# Patient Record
Sex: Female | Born: 1960
Health system: Southern US, Community
[De-identification: ages and names within clinical notes are randomized; demographics above are authoritative.]

## PROBLEM LIST (undated history)

## (undated) DIAGNOSIS — S83209A Unspecified tear of unspecified meniscus, current injury, unspecified knee, initial encounter: Secondary | ICD-10-CM

## (undated) DIAGNOSIS — Z8616 Personal history of COVID-19: Secondary | ICD-10-CM

## (undated) DIAGNOSIS — N200 Calculus of kidney: Secondary | ICD-10-CM

## (undated) DIAGNOSIS — Z803 Family history of malignant neoplasm of breast: Secondary | ICD-10-CM

## (undated) DIAGNOSIS — Z1371 Encounter for nonprocreative screening for genetic disease carrier status: Secondary | ICD-10-CM

## (undated) DIAGNOSIS — F3281 Premenstrual dysphoric disorder: Secondary | ICD-10-CM

## (undated) DIAGNOSIS — N816 Rectocele: Secondary | ICD-10-CM

## (undated) DIAGNOSIS — J45990 Exercise induced bronchospasm: Secondary | ICD-10-CM

## (undated) DIAGNOSIS — R7303 Prediabetes: Secondary | ICD-10-CM

## (undated) DIAGNOSIS — N943 Premenstrual tension syndrome: Secondary | ICD-10-CM

## (undated) DIAGNOSIS — R002 Palpitations: Secondary | ICD-10-CM

## (undated) DIAGNOSIS — L719 Rosacea, unspecified: Secondary | ICD-10-CM

## (undated) DIAGNOSIS — Z8481 Family history of carrier of genetic disease: Secondary | ICD-10-CM

## (undated) DIAGNOSIS — M8580 Other specified disorders of bone density and structure, unspecified site: Secondary | ICD-10-CM

## (undated) DIAGNOSIS — E559 Vitamin D deficiency, unspecified: Secondary | ICD-10-CM

## (undated) DIAGNOSIS — I10 Essential (primary) hypertension: Secondary | ICD-10-CM

## (undated) DIAGNOSIS — Z8041 Family history of malignant neoplasm of ovary: Secondary | ICD-10-CM

## (undated) DIAGNOSIS — E785 Hyperlipidemia, unspecified: Secondary | ICD-10-CM

## (undated) DIAGNOSIS — E7800 Pure hypercholesterolemia, unspecified: Secondary | ICD-10-CM

## (undated) DIAGNOSIS — IMO0002 Reserved for concepts with insufficient information to code with codable children: Secondary | ICD-10-CM

## (undated) HISTORY — DX: Family history of malignant neoplasm of breast: Z80.3

## (undated) HISTORY — DX: Exercise induced bronchospasm: J45.990

## (undated) HISTORY — PX: COLONOSCOPY: SHX174

## (undated) HISTORY — DX: Other specified disorders of bone density and structure, unspecified site: M85.80

## (undated) HISTORY — DX: Rectocele: N81.6

## (undated) HISTORY — DX: Rosacea, unspecified: L71.9

## (undated) HISTORY — DX: Essential (primary) hypertension: I10

## (undated) HISTORY — DX: Hyperlipidemia, unspecified: E78.5

## (undated) HISTORY — DX: Personal history of COVID-19: Z86.16

## (undated) HISTORY — DX: Encounter for nonprocreative screening for genetic disease carrier status: Z13.71

## (undated) HISTORY — DX: Vitamin D deficiency, unspecified: E55.9

## (undated) HISTORY — DX: Family history of carrier of genetic disease: Z84.81

## (undated) HISTORY — DX: Pure hypercholesterolemia, unspecified: E78.00

## (undated) HISTORY — DX: Premenstrual tension syndrome: N94.3

## (undated) HISTORY — DX: Unspecified tear of unspecified meniscus, current injury, unspecified knee, initial encounter: S83.209A

## (undated) HISTORY — DX: Family history of malignant neoplasm of ovary: Z80.41

## (undated) HISTORY — DX: Palpitations: R00.2

## (undated) HISTORY — DX: Calculus of kidney: N20.0

## (undated) HISTORY — DX: Premenstrual dysphoric disorder: F32.81

## (undated) HISTORY — DX: Prediabetes: R73.03

## (undated) HISTORY — DX: Reserved for concepts with insufficient information to code with codable children: IMO0002

---

## 1999-10-06 ENCOUNTER — Other Ambulatory Visit: Admission: RE | Admit: 1999-10-06 | Discharge: 1999-10-06 | Payer: Self-pay | Admitting: Obstetrics and Gynecology

## 2000-12-30 ENCOUNTER — Other Ambulatory Visit: Admission: RE | Admit: 2000-12-30 | Discharge: 2000-12-30 | Payer: Self-pay | Admitting: Obstetrics and Gynecology

## 2002-10-23 ENCOUNTER — Other Ambulatory Visit: Admission: RE | Admit: 2002-10-23 | Discharge: 2002-10-23 | Payer: Self-pay | Admitting: Obstetrics and Gynecology

## 2002-11-08 ENCOUNTER — Encounter: Payer: Self-pay | Admitting: Obstetrics and Gynecology

## 2002-11-08 ENCOUNTER — Ambulatory Visit (HOSPITAL_COMMUNITY): Admission: RE | Admit: 2002-11-08 | Discharge: 2002-11-08 | Payer: Self-pay | Admitting: Obstetrics and Gynecology

## 2003-12-24 ENCOUNTER — Other Ambulatory Visit: Admission: RE | Admit: 2003-12-24 | Discharge: 2003-12-24 | Payer: Self-pay | Admitting: Obstetrics and Gynecology

## 2005-07-30 ENCOUNTER — Other Ambulatory Visit: Admission: RE | Admit: 2005-07-30 | Discharge: 2005-07-30 | Payer: Self-pay | Admitting: Obstetrics and Gynecology

## 2005-08-31 ENCOUNTER — Ambulatory Visit: Payer: Self-pay | Admitting: Internal Medicine

## 2005-09-09 ENCOUNTER — Other Ambulatory Visit: Admission: RE | Admit: 2005-09-09 | Discharge: 2005-09-09 | Payer: Self-pay | Admitting: Obstetrics and Gynecology

## 2005-11-13 ENCOUNTER — Encounter: Admission: RE | Admit: 2005-11-13 | Discharge: 2005-11-13 | Payer: Self-pay | Admitting: Obstetrics & Gynecology

## 2006-04-22 ENCOUNTER — Other Ambulatory Visit: Admission: RE | Admit: 2006-04-22 | Discharge: 2006-04-22 | Payer: Self-pay | Admitting: Obstetrics & Gynecology

## 2006-07-02 ENCOUNTER — Other Ambulatory Visit: Admission: RE | Admit: 2006-07-02 | Discharge: 2006-07-02 | Payer: Self-pay | Admitting: Obstetrics & Gynecology

## 2006-10-19 ENCOUNTER — Other Ambulatory Visit: Admission: RE | Admit: 2006-10-19 | Discharge: 2006-10-19 | Payer: Self-pay | Admitting: Obstetrics & Gynecology

## 2007-12-22 ENCOUNTER — Other Ambulatory Visit: Admission: RE | Admit: 2007-12-22 | Discharge: 2007-12-22 | Payer: Self-pay | Admitting: Obstetrics and Gynecology

## 2008-02-02 ENCOUNTER — Encounter: Admission: RE | Admit: 2008-02-02 | Discharge: 2008-02-02 | Payer: Self-pay | Admitting: Obstetrics and Gynecology

## 2009-02-05 ENCOUNTER — Other Ambulatory Visit: Admission: RE | Admit: 2009-02-05 | Discharge: 2009-02-05 | Payer: Self-pay | Admitting: Obstetrics and Gynecology

## 2009-02-12 ENCOUNTER — Ambulatory Visit: Payer: Self-pay | Admitting: Genetic Counselor

## 2009-03-18 DIAGNOSIS — Z1371 Encounter for nonprocreative screening for genetic disease carrier status: Secondary | ICD-10-CM

## 2009-03-18 HISTORY — DX: Encounter for nonprocreative screening for genetic disease carrier status: Z13.71

## 2010-06-24 ENCOUNTER — Encounter: Admission: RE | Admit: 2010-06-24 | Discharge: 2010-06-24 | Payer: Self-pay | Admitting: Obstetrics & Gynecology

## 2012-07-18 ENCOUNTER — Other Ambulatory Visit: Payer: Self-pay | Admitting: Obstetrics & Gynecology

## 2012-07-18 DIAGNOSIS — Z1231 Encounter for screening mammogram for malignant neoplasm of breast: Secondary | ICD-10-CM

## 2012-07-29 ENCOUNTER — Ambulatory Visit
Admission: RE | Admit: 2012-07-29 | Discharge: 2012-07-29 | Disposition: A | Discharge status: Home / Self Care | Payer: BC Managed Care – PPO | Source: Ambulatory Visit | Attending: Obstetrics & Gynecology | Admitting: Obstetrics & Gynecology

## 2012-07-29 DIAGNOSIS — Z1231 Encounter for screening mammogram for malignant neoplasm of breast: Secondary | ICD-10-CM

## 2012-08-02 ENCOUNTER — Other Ambulatory Visit: Payer: Self-pay | Admitting: Obstetrics & Gynecology

## 2012-08-02 DIAGNOSIS — R928 Other abnormal and inconclusive findings on diagnostic imaging of breast: Secondary | ICD-10-CM

## 2012-08-05 ENCOUNTER — Other Ambulatory Visit: Payer: Self-pay | Admitting: Obstetrics & Gynecology

## 2012-08-05 ENCOUNTER — Ambulatory Visit
Admission: RE | Admit: 2012-08-05 | Discharge: 2012-08-05 | Disposition: A | Discharge status: Home / Self Care | Payer: BC Managed Care – PPO | Source: Ambulatory Visit | Attending: Obstetrics & Gynecology | Admitting: Obstetrics & Gynecology

## 2012-08-05 DIAGNOSIS — R928 Other abnormal and inconclusive findings on diagnostic imaging of breast: Secondary | ICD-10-CM

## 2012-08-05 LAB — HM MAMMOGRAPHY: HM Mammogram: NEGATIVE

## 2012-08-12 ENCOUNTER — Other Ambulatory Visit: Payer: BC Managed Care – PPO

## 2013-04-04 ENCOUNTER — Encounter: Payer: Self-pay | Admitting: *Deleted

## 2013-04-10 ENCOUNTER — Encounter: Payer: Self-pay | Admitting: Nurse Practitioner

## 2013-04-10 ENCOUNTER — Ambulatory Visit (INDEPENDENT_AMBULATORY_CARE_PROVIDER_SITE_OTHER): Payer: BC Managed Care – PPO | Admitting: Nurse Practitioner

## 2013-04-10 VITALS — BP 114/66 | HR 62 | Resp 14 | Ht 69.0 in | Wt 143.2 lb

## 2013-04-10 DIAGNOSIS — E559 Vitamin D deficiency, unspecified: Secondary | ICD-10-CM

## 2013-04-10 DIAGNOSIS — Z01419 Encounter for gynecological examination (general) (routine) without abnormal findings: Secondary | ICD-10-CM

## 2013-04-10 DIAGNOSIS — Z0000 Encounter for general adult medical examination without abnormal findings: Secondary | ICD-10-CM

## 2013-04-10 LAB — POCT URINALYSIS DIPSTICK
Leukocytes, UA: NEGATIVE
Spec Grav, UA: 1.015
pH, UA: 5.5

## 2013-04-10 MED ORDER — VITAMIN D (ERGOCALCIFEROL) 1.25 MG (50000 UNIT) PO CAPS: 50000.0000 [IU] | ORAL_CAPSULE | ORAL | Status: DC

## 2013-04-10 NOTE — Patient Instructions (Addendum)

## 2013-04-10 NOTE — Progress Notes (Signed)
Reviewed personally.  M. Suzanne Calena Salem, MD.  

## 2013-04-10 NOTE — Progress Notes (Signed)
52 y.o. G4P3 Married Caucasian Fe here for annual exam.  Menses regular at 24 days, flow for 5 days, 1 day heavier. No vaso symptoms.  Feels well.  Some marital issues currently and patient feeling stressed.  Patient's last menstrual period was 03/26/2013.          Sexually active: yes  The current method of family planning is vasectomy.    Exercising: yes  run Smoker:  no  Health Maintenance: Pap:  04/05/2012  Normal with negative HR HPV MMG:  08/05/2012  negative Colonoscopy:  05/20/2012 normal and recheck in 10 yrs. BMD:   never TDaP:  02/05/2009 Labs: PCP does lab (blood) work.    reports that she has never smoked. She has never used smokeless tobacco. She reports that she drinks about 2.0 ounces of alcohol per week. She reports that she does not use illicit drugs.  Past Medical History  Diagnosis Date  . BRCA negative   . PMS (premenstrual syndrome)   . Osteopenia   . Cystocele   . Rectocele   . Kidney stone     Past Surgical History  Procedure Laterality Date  . Vaginal delivery      x3  . Colonoscopy      Current Outpatient Prescriptions  Medication Sig Dispense Refill  . aspirin 81 MG tablet Take 81 mg by mouth daily.      Marland Kitchen aspirin-acetaminophen-caffeine (EXCEDRIN MIGRAINE) 250-250-65 MG per tablet Take 1 tablet by mouth every 6 (six) hours as needed for pain.      . calcium-vitamin D (OSCAL WITH D) 250-125 MG-UNIT per tablet Take 1 tablet by mouth daily.      Marland Kitchen ibuprofen (ADVIL,MOTRIN) 200 MG tablet Take 200 mg by mouth every 6 (six) hours as needed for pain.      Marland Kitchen loratadine (CLARITIN) 10 MG tablet Take 10 mg by mouth daily.      . Multiple Vitamin (MULTIVITAMIN) tablet Take 1 tablet by mouth daily.      . Vitamin D, Ergocalciferol, (DRISDOL) 50000 UNITS CAPS Take 50,000 Units by mouth every 7 (seven) days.       No current facility-administered medications for this visit.    Family History  Problem Relation Age of Onset  . Breast cancer Mother   . Breast  cancer Maternal Aunt   . Cancer Maternal Grandmother     colon cancer  . Breast cancer Cousin     ROS:  Pertinent items are noted in HPI.  Otherwise, a comprehensive ROS was negative.  Exam:   BP 114/66  Pulse 62  Resp 14  Ht 5\' 9"  (1.753 m)  Wt 143 lb 3.2 oz (64.955 kg)  BMI 21.14 kg/m2  LMP 03/26/2013 Height: 5\' 9"  (175.3 cm)  Ht Readings from Last 3 Encounters:  04/10/13 5\' 9"  (1.753 m)  Weight on 04/05/12 was 155 lb. Down 12 lbs.  General appearance: alert, cooperative and appears stated age, seems sad today, tearful at times Head: Normocephalic, without obvious abnormality, atraumatic Neck: no adenopathy, supple, symmetrical, trachea midline and thyroid normal to inspection and palpation Lungs: clear to auscultation bilaterally Breasts: normal appearance, no masses or tenderness Heart: regular rate and rhythm Abdomen: soft, non-tender; no masses,  no organomegaly Extremities: extremities normal, atraumatic, no cyanosis or edema Skin: Skin color, texture, turgor normal. No rashes or lesions Lymph nodes: Cervical, supraclavicular, and axillary nodes normal. No abnormal inguinal nodes palpated Neurologic: Grossly normal   Pelvic: External genitalia:  no lesions  Urethra:  normal appearing urethra with no masses, tenderness or lesions              Bartholin's and Skene's: normal                 Vagina: normal appearing vagina with normal color and discharge, no lesions              Cervix: anteverted              Pap taken: no Bimanual Exam:  Uterus:  normal size, contour, position, consistency, mobility, non-tender              Adnexa: no mass, fullness, tenderness               Rectovaginal: Confirms               Anus:  normal sphincter tone, no lesions  A:  Well Woman with normal exam  Husband vasectomy  Perimenopausal with regular cycles  Situational anxiety  P:   Pap smear as per guidelines   Mammogram due 10/14  Discussed treatment options for her  anxiety and have advised her to see PCP   for evaluation and follow up  counseled on adequate intake of calcium and vitamin D, diet and exercise return annually or prn  An After Visit Summary was printed and given to the patient.

## 2013-04-11 ENCOUNTER — Telehealth: Payer: Self-pay | Admitting: *Deleted

## 2013-04-11 NOTE — Telephone Encounter (Signed)
LVM for pt to return my call in regards to lab results.  

## 2013-04-11 NOTE — Telephone Encounter (Signed)
Message copied by Osie Bond on Tue Apr 11, 2013  4:24 PM ------      Message from: Roanna Banning      Created: Tue Apr 11, 2013  3:25 PM       Let patient know vit D level is great and follow protocol ------

## 2013-04-12 ENCOUNTER — Telehealth: Payer: Self-pay | Admitting: *Deleted

## 2013-04-12 NOTE — Telephone Encounter (Signed)
Pt is aware of vitamin d lab results and will continue to take vitamin d 600-800 IU (otc) po qd.

## 2013-04-12 NOTE — Telephone Encounter (Signed)
Message copied by Osie Bond on Wed Apr 12, 2013  3:35 PM ------      Message from: Roanna Banning      Created: Tue Apr 11, 2013  3:25 PM       Let patient know vit D level is great and follow protocol ------

## 2013-04-12 NOTE — Telephone Encounter (Signed)
Pt is aware of vitamin d lab results and will continue to take vitamin d 600-800 IU (otc) po qd.  

## 2014-01-29 ENCOUNTER — Other Ambulatory Visit: Payer: Self-pay

## 2014-01-29 DIAGNOSIS — Z1231 Encounter for screening mammogram for malignant neoplasm of breast: Secondary | ICD-10-CM

## 2014-01-31 ENCOUNTER — Ambulatory Visit
Admission: RE | Admit: 2014-01-31 | Discharge: 2014-01-31 | Disposition: A | Discharge status: Home / Self Care | Payer: BC Managed Care – PPO | Source: Ambulatory Visit

## 2014-01-31 DIAGNOSIS — Z1231 Encounter for screening mammogram for malignant neoplasm of breast: Secondary | ICD-10-CM

## 2014-04-12 ENCOUNTER — Encounter: Payer: Self-pay | Admitting: Nurse Practitioner

## 2014-04-12 ENCOUNTER — Ambulatory Visit (INDEPENDENT_AMBULATORY_CARE_PROVIDER_SITE_OTHER): Payer: BC Managed Care – PPO | Admitting: Nurse Practitioner

## 2014-04-12 VITALS — BP 110/70 | HR 68 | Resp 16 | Ht 69.25 in | Wt 147.0 lb

## 2014-04-12 DIAGNOSIS — Z01419 Encounter for gynecological examination (general) (routine) without abnormal findings: Secondary | ICD-10-CM

## 2014-04-12 DIAGNOSIS — Z0000 Encounter for general adult medical examination without abnormal findings: Secondary | ICD-10-CM

## 2014-04-12 LAB — POCT URINALYSIS DIPSTICK
Bilirubin, UA: NEGATIVE
Glucose, UA: NEGATIVE
KETONES UA: NEGATIVE
LEUKOCYTES UA: NEGATIVE
Nitrite, UA: NEGATIVE
PH UA: 5
PROTEIN UA: NEGATIVE
RBC UA: NEGATIVE
Urobilinogen, UA: NEGATIVE

## 2014-04-12 NOTE — Progress Notes (Signed)
53 y.o. G4P3 Separated Caucasian Fe here for annual exam.  She and husband have been working on marital issues for some time.  He has now moved out in February.  She feels heart broken that she has tried so hard to make things work in their relationship.  Some warm flushes.  Menses are very regular at 24 days.  Flow for 5-6 days. Moderate to light.  No cramps.  Not SA.  Patient's last menstrual period was 04/07/2014.          Sexually active: yes  The current method of family planning is vasectomy.    Exercising: yes  Run 4 - 5 x weekly Smoker:  no  Health Maintenance: Pap:  04/2012 Neg. HR HPV neg  MMG:  01/2014 BIRADS1 Colonoscopy:  2013 - Normal Every 5 years TDaP:  03/2014 Labs: PCP   reports that she has never smoked. She has never used smokeless tobacco. She reports that she drinks about 4.2 ounces of alcohol per week. She reports that she does not use illicit drugs.  Past Medical History  Diagnosis Date  . PMS (premenstrual syndrome)   . Osteopenia   . Cystocele   . Rectocele   . Kidney stone 20's  . BRCA1 negative 03/18/09    [facility] Cancer - mother, MGM, MA, MGA X 2, Mcousin    Past Surgical History  Procedure Laterality Date  . Vaginal delivery      x3  . Colonoscopy      Current Outpatient Prescriptions  Medication Sig Dispense Refill  . aspirin 81 MG tablet Take 81 mg by mouth daily.      Marland Kitchen aspirin-acetaminophen-caffeine (EXCEDRIN MIGRAINE) 250-250-65 MG per tablet Take 1 tablet by mouth every 6 (six) hours as needed for pain.      Marland Kitchen ibuprofen (ADVIL,MOTRIN) 200 MG tablet Take 200 mg by mouth every 6 (six) hours as needed for pain.      Marland Kitchen loratadine (CLARITIN) 10 MG tablet Take 10 mg by mouth as needed.       . Multiple Vitamin (MULTIVITAMIN) tablet Take 1 tablet by mouth daily.      . Vitamin D, Ergocalciferol, (DRISDOL) 50000 UNITS CAPS Take 1 capsule (50,000 Units total) by mouth every 7 (seven) days.  30 capsule  3  . calcium-vitamin D (OSCAL WITH D)  250-125 MG-UNIT per tablet Take 1 tablet by mouth daily.       No current facility-administered medications for this visit.    Family History  Problem Relation Age of Onset  . Breast cancer Mother 55  . Breast cancer Maternal Aunt 60  . Cancer - Colon Maternal Grandmother     colon cancer  . Breast cancer Cousin   . Heart disease Father   . Hypertension Father   . Breast cancer Cousin 25    maternal    ROS:  Pertinent items are noted in HPI.  Otherwise, a comprehensive ROS was negative.  Exam:   BP 110/70  Pulse 68  Resp 16  Ht 5' 9.25" (1.759 m)  Wt 147 lb (66.679 kg)  BMI 21.55 kg/m2  LMP 04/07/2014 Height: 5' 9.25" (175.9 cm)  Ht Readings from Last 3 Encounters:  04/12/14 5' 9.25" (1.759 m)  04/10/13 '5\' 9"'  (1.753 m)    General appearance: alert, cooperative and appears stated age Head: Normocephalic, without obvious abnormality, atraumatic Neck: no adenopathy, supple, symmetrical, trachea midline and thyroid normal to inspection and palpation Lungs: clear to auscultation bilaterally Breasts: normal  appearance, no masses or tenderness Heart: regular rate and rhythm Abdomen: soft, non-tender; no masses,  no organomegaly Extremities: extremities normal, atraumatic, no cyanosis or edema Skin: Skin color, texture, turgor normal. No rashes or lesions Lymph nodes: Cervical, supraclavicular, and axillary nodes normal. No abnormal inguinal nodes palpated Neurologic: Grossly normal   Pelvic: External genitalia:  no lesions              Urethra:  normal appearing urethra with no masses, tenderness or lesions              Bartholin's and Skene's: normal                 Vagina: normal appearing vagina with normal color and discharge, no lesions              Cervix: anteverted              Pap taken: no Bimanual Exam:  Uterus:  normal size, contour, position, consistency, mobility, non-tender              Adnexa: no mass, fullness, tenderness               Rectovaginal:  Confirms               Anus:  normal sphincter tone, no lesions  A:  Well Woman with normal exam  Perimenopausal still with regular menses  Marital discord  P:   Reviewed health and wellness pertinent to exam  Pap smear not taken today  Mammogram is due 4/16  Counseled on breast self exam, mammography screening, adequate intake of calcium and vitamin D, diet and exercise, Kegel's exercises return annually or prn  An After Visit Summary was printed and given to the patient.

## 2014-04-12 NOTE — Patient Instructions (Signed)

## 2014-04-19 NOTE — Progress Notes (Signed)
Encounter reviewed by Dr. Brook Silva.  

## 2014-09-03 ENCOUNTER — Encounter: Payer: Self-pay | Admitting: Nurse Practitioner

## 2015-04-18 ENCOUNTER — Ambulatory Visit: Payer: BC Managed Care – PPO | Admitting: Nurse Practitioner

## 2015-04-30 ENCOUNTER — Ambulatory Visit (INDEPENDENT_AMBULATORY_CARE_PROVIDER_SITE_OTHER): Payer: BLUE CROSS/BLUE SHIELD | Admitting: Nurse Practitioner

## 2015-04-30 ENCOUNTER — Encounter: Payer: Self-pay | Admitting: Nurse Practitioner

## 2015-04-30 VITALS — BP 110/66 | HR 64 | Ht 69.5 in | Wt 147.0 lb

## 2015-04-30 DIAGNOSIS — Z0000 Encounter for general adult medical examination without abnormal findings: Secondary | ICD-10-CM | POA: Diagnosis not present

## 2015-04-30 DIAGNOSIS — Z01419 Encounter for gynecological examination (general) (routine) without abnormal findings: Secondary | ICD-10-CM

## 2015-04-30 LAB — POCT URINALYSIS DIPSTICK
Bilirubin, UA: NEGATIVE
Glucose, UA: NEGATIVE
KETONES UA: NEGATIVE
Leukocytes, UA: NEGATIVE
Nitrite, UA: NEGATIVE
PROTEIN UA: NEGATIVE
RBC UA: NEGATIVE
UROBILINOGEN UA: NEGATIVE
pH, UA: 5

## 2015-04-30 NOTE — Progress Notes (Signed)
Encounter reviewed by Dr. Brook Amundson C. Silva.  

## 2015-04-30 NOTE — Progress Notes (Signed)
Patient ID: Mary Porter, female   DOB: 1961-09-19, 54 y.o.   MRN: [id number] 54 y.o. G4P3 Married  Caucasian Fe here for annual exam.  Menses 21- 24 days.  Moderate to light, lasting 5-7.  No vaso symptoms.  She and husband still working on issues.  Patient's last menstrual period was 04/20/2015 (exact date).          Sexually active: Yes.    The current method of family planning is vasectomy.    Exercising: Yes.    Gym/ health club routine includes light weights, low impact aerobics and treadmill. Smoker:  no  Health Maintenance: Pap:  04/05/12, negative with neg HR HPV MMG:  01/31/14, Bi-Rads 1:  Negative  Colonoscopy:  05/10/12, normal, repeat in 5 years BMD:  Years ago, normal per patient TDaP:  03/2014 Labs:  HB:  12.7  Urine: negative    reports that she has never smoked. She has never used smokeless tobacco. She reports that she drinks about 4.2 oz of alcohol per week. She reports that she does not use illicit drugs.  Past Medical History  Diagnosis Date  . PMS (premenstrual syndrome)   . Osteopenia   . Cystocele   . Rectocele   . Kidney stone 20's  . BRCA1 negative 03/18/09    [facility] - mother, MGM, MA, MGA X 2, Mcousin    Past Surgical History  Procedure Laterality Date  . Vaginal delivery      x3  . Colonoscopy      Current Outpatient Prescriptions  Medication Sig Dispense Refill  . aspirin 81 MG tablet Take 81 mg by mouth daily.    Marland Kitchen aspirin-acetaminophen-caffeine (EXCEDRIN MIGRAINE) 250-250-65 MG per tablet Take 1 tablet by mouth every 6 (six) hours as needed for pain.    . calcium-vitamin D (OSCAL WITH D) 250-125 MG-UNIT per tablet Take 1 tablet by mouth daily.    Marland Kitchen ibuprofen (ADVIL,MOTRIN) 200 MG tablet Take 200 mg by mouth every 6 (six) hours as needed for pain.    Marland Kitchen loratadine (CLARITIN) 10 MG tablet Take 10 mg by mouth as needed.     . Multiple Vitamin (MULTIVITAMIN) tablet Take 1 tablet by mouth daily.    . Vitamin D, Ergocalciferol, (DRISDOL)  50000 UNITS CAPS Take 1 capsule (50,000 Units total) by mouth every 7 (seven) days. (Patient not taking: Reported on 04/30/2015) 30 capsule 3   No current facility-administered medications for this visit.    Family History  Problem Relation Age of Onset  . Breast cancer Mother 74  . Breast cancer Maternal Aunt 60  . Cancer - Colon Maternal Grandmother     colon cancer  . Breast cancer Cousin   . Heart disease Father   . Hypertension Father   . Breast cancer Cousin 25    maternal    ROS:  Pertinent items are noted in HPI.  Otherwise, a comprehensive ROS was negative.  Exam:   BP 110/66 mmHg  Pulse 64  Ht 5' 9.5" (1.765 m)  Wt 147 lb (66.679 kg)  BMI 21.40 kg/m2  LMP 04/20/2015 (Exact Date) Height: 5' 9.5" (176.5 cm) Ht Readings from Last 3 Encounters:  04/30/15 5' 9.5" (1.765 m)  04/12/14 5' 9.25" (1.759 m)  04/10/13 _0  (1.753 m)    General appearance: alert, cooperative and appears stated age Head: Normocephalic, without obvious abnormality, atraumatic Neck: no adenopathy, supple, symmetrical, trachea midline and thyroid normal to inspection and palpation Lungs: clear to auscultation bilaterally  Breasts: normal appearance, no masses or tenderness Heart: regular rate and rhythm Abdomen: soft, non-tender; no masses,  no organomegaly Extremities: extremities normal, atraumatic, no cyanosis or edema Skin: Skin color, texture, turgor normal. No rashes or lesions Lymph nodes: Cervical, supraclavicular, and axillary nodes normal. No abnormal inguinal nodes palpated Neurologic: Grossly normal   Pelvic: External genitalia:  no lesions              Urethra:  normal appearing urethra with no masses, tenderness or lesions              Bartholin's and Skene's: normal                 Vagina: normal appearing vagina with normal color and discharge, no lesions              Cervix: anteverted              Pap taken: Yes.   Bimanual Exam:  Uterus:  normal size, contour, position,  consistency, mobility, non-tender              Adnexa: no mass, fullness, tenderness               Rectovaginal: Confirms               Anus:  normal sphincter tone, no lesions  Chaperone present:  yes  A:  Well Woman with normal exam  Perimenopausal still with regular menses Marital discord   P:   Reviewed health and wellness pertinent to exam  Pap smear as above  Mammogram is due and will schedule  Keep menses record  Counseled on breast self exam, mammography screening, adequate intake of calcium and vitamin D, diet and exercise return annually or prn  An After Visit Summary was printed and given to the patient.

## 2015-04-30 NOTE — Patient Instructions (Addendum)

## 2015-05-03 LAB — IPS PAP TEST WITH HPV

## 2015-05-07 LAB — HEMOGLOBIN, FINGERSTICK: HEMOGLOBIN, FINGERSTICK: 12.7 g/dL (ref 12.0–16.0)

## 2015-06-18 ENCOUNTER — Other Ambulatory Visit: Payer: Self-pay

## 2015-06-18 DIAGNOSIS — Z1231 Encounter for screening mammogram for malignant neoplasm of breast: Secondary | ICD-10-CM

## 2015-07-23 ENCOUNTER — Ambulatory Visit
Admission: RE | Admit: 2015-07-23 | Discharge: 2015-07-23 | Disposition: A | Discharge status: Home / Self Care | Payer: 59 | Source: Ambulatory Visit

## 2015-07-23 DIAGNOSIS — Z1231 Encounter for screening mammogram for malignant neoplasm of breast: Secondary | ICD-10-CM

## 2015-07-25 ENCOUNTER — Other Ambulatory Visit: Payer: Self-pay | Admitting: Nurse Practitioner

## 2015-07-25 DIAGNOSIS — R928 Other abnormal and inconclusive findings on diagnostic imaging of breast: Secondary | ICD-10-CM

## 2015-07-26 ENCOUNTER — Ambulatory Visit
Admission: RE | Admit: 2015-07-26 | Discharge: 2015-07-26 | Disposition: A | Discharge status: Home / Self Care | Payer: 59 | Source: Ambulatory Visit | Attending: Nurse Practitioner | Admitting: Nurse Practitioner

## 2015-07-26 DIAGNOSIS — R928 Other abnormal and inconclusive findings on diagnostic imaging of breast: Secondary | ICD-10-CM

## 2015-12-11 ENCOUNTER — Telehealth: Payer: Self-pay | Admitting: Nurse Practitioner

## 2015-12-11 ENCOUNTER — Ambulatory Visit (INDEPENDENT_AMBULATORY_CARE_PROVIDER_SITE_OTHER): Payer: 59 | Admitting: Sports Medicine

## 2015-12-11 ENCOUNTER — Encounter: Payer: Self-pay | Admitting: Sports Medicine

## 2015-12-11 VITALS — BP 129/71 | Ht 69.0 in | Wt 155.0 lb

## 2015-12-11 DIAGNOSIS — M23306 Other meniscus derangements, unspecified meniscus, right knee: Secondary | ICD-10-CM | POA: Diagnosis not present

## 2015-12-11 DIAGNOSIS — M23303 Other meniscus derangements, unspecified medial meniscus, right knee: Secondary | ICD-10-CM | POA: Diagnosis not present

## 2015-12-11 DIAGNOSIS — M25561 Pain in right knee: Secondary | ICD-10-CM

## 2015-12-11 DIAGNOSIS — M23300 Other meniscus derangements, unspecified lateral meniscus, right knee: Secondary | ICD-10-CM | POA: Diagnosis not present

## 2015-12-11 NOTE — Telephone Encounter (Signed)
Spoke with patient. Patient states that her cycle started on January 28th and she is still bleeding. Reports bleeding was "very heavy in the beginning." Bleeding has returned to a normal flow. Reports she has not missed any cycles. Has been having cycles monthly that usually last for 5-7 days. States she did go longer in between cycle this month. Denies any fatigue or dizziness. Current method of birth control is vasectomy. Advised she will need to be seen in the office for further evaluation. She is agreeable. Appointment scheduled for 2/10 at 9:45 am with Ria Comment, FNP. She is agreeable to date and time.  Routing to provider for final review. Patient agreeable to disposition. Will close encounter.

## 2015-12-11 NOTE — Progress Notes (Signed)
Mary Porter - 55 y.o. female MRN [id number]  Date of birth: Sep 27, 1961  CC: Right knee pain  SUBJECTIVE:   HPI  Mary Porter is a very pleasant 55 year old female with 3-4 months of right knee pain who is here for evaluation and follow-up on an MRI done by another provider. She states that her knee pain began insidiously in November 2016. She denies any trauma. At that time she had been very active doing [employer] high interval training and running as well as occasional yoga. She denied any locking catching or giving way at that time or now. She denies any fevers chills or night sweats. She was seen by which showed a medial posterior horn degenerative meniscal tear as well as a relatively large Baker's cyst. She also had an MRI of her lumbar spine which was unremarkable. These reports were reviewed today although the images were not available. She initially had some burning pain radiating down from the lateral aspect of her knee to the anterior ankle. After resting for 2 weeks over Christmas break this is resolved and the majority of her knee pain has gone away with the help of an injection in December 2016. She also wore a patellar tendon stabilization brace which is helped although she does not continue to use this. She is not using any medications. Currently she is walking biking and using the elliptical which provide minimal pain. She also modifies her yoga to limit terminal knee flexion.  ROS:     As above, no fevers chills night sweats. No rashes or joint swelling.  HISTORY: Past Medical, Surgical, Social, and Family History Reviewed & Updated per EMR.  Pertinent Historical Findings include: Osteopenia, never smoker  OBJECTIVE: BP 129/71 mmHg  Ht  (1.753 m)  Wt 155 lb (70.308 kg)  BMI 22.88 kg/m2  Physical Exam  Calm, no acute distress, nonlabored breathing  Knee: Right Normal to inspection with no erythema or effusion or obvious bony abnormalities. Palpation normal with no  warmth or joint line tenderness or patellar tenderness or condyle tenderness. Range of motion is full with flexion although terminal discomfort with extension mentioned to 5 Ligaments with solid consistent endpoints including ACL, PCL, LCL, MCL. Negative Mcmurray's and provocative meniscal tests. Non painful patellar compression. Quad muscle bulk for compared to the contralateral side, 42 versus 44 cm in circumference Hip abduction strength on right side is 4 out of 5 compared to 5 out of 5 on the left. leg lengths are equal.  MEDICATIONS, LABS & OTHER ORDERS: Previous Medications   ASPIRIN 81 MG TABLET    Take 81 mg by mouth daily.   ASPIRIN-ACETAMINOPHEN-CAFFEINE (EXCEDRIN MIGRAINE) 250-250-65 MG PER TABLET    Take 1 tablet by mouth every 6 (six) hours as needed for pain.   CALCIUM-VITAMIN D (OSCAL WITH D) 250-125 MG-UNIT PER TABLET    Take 1 tablet by mouth daily.   IBUPROFEN (ADVIL,MOTRIN) 200 MG TABLET    Take 200 mg by mouth every 6 (six) hours as needed for pain.   LORATADINE (CLARITIN) 10 MG TABLET    Take 10 mg by mouth as needed.    MULTIPLE VITAMIN (MULTIVITAMIN) TABLET    Take 1 tablet by mouth daily.   VITAMIN D, ERGOCALCIFEROL, (DRISDOL) 50000 UNITS CAPS    Take 1 capsule (50,000 Units total) by mouth every 7 (seven) days.   Modified Medications   No medications on file   New Prescriptions   No medications on file   Discontinued Medications  No medications on file  No orders of the defined types were placed in this encounter.   ASSESSMENT & PLAN: Right knee pain with degenerative posterior horn medial meniscal tear on the right side.  Mary Porter is very active and her exam does not at all suggest severe patellofemoral arthritis as identified on the MRI other than lack of terminal extension. She does have signs of a degenerative posterior horn medial meniscal tear with pain exhibited with deep knee flexion. She has significant weakness and quad atrophy on the right side  as well as hip abduction weakness. We discussed ideal exercises including recumbent biking, walking, and Pilates. She will plan on participating in Pilates to help fix her unilateral quad and pelvic stabilization weakness. We will see her back in 2 months. She is to call with any questions in the interim.

## 2015-12-11 NOTE — Telephone Encounter (Signed)
Patient has been bleeding for 11 days and is still bleeding. She is concern and wants to talk with the nurse.

## 2015-12-13 ENCOUNTER — Encounter: Payer: Self-pay | Admitting: Nurse Practitioner

## 2015-12-13 ENCOUNTER — Ambulatory Visit (INDEPENDENT_AMBULATORY_CARE_PROVIDER_SITE_OTHER): Payer: 59 | Admitting: Nurse Practitioner

## 2015-12-13 VITALS — BP 120/76 | HR 72 | Ht 69.0 in | Wt 155.0 lb

## 2015-12-13 DIAGNOSIS — N939 Abnormal uterine and vaginal bleeding, unspecified: Secondary | ICD-10-CM | POA: Diagnosis not present

## 2015-12-13 LAB — HEMOGLOBIN, FINGERSTICK: Hemoglobin, fingerstick: 13.2 g/dL (ref 12.0–16.0)

## 2015-12-13 MED ORDER — MEDROXYPROGESTERONE ACETATE 10 MG PO TABS: 10.0000 mg | ORAL_TABLET | Freq: Every day | ORAL | Status: DC

## 2015-12-13 NOTE — Progress Notes (Signed)
S:   This 55 yo WM Female G4,P3 who is perimenopausal has been having fairly regular menses at 21- 24 days.  Normally last 5-7 days.  This past cycle started on January 28 th and she is still bleeding. Reports bleeding was 'very heavy in the beginning' like a normal cycle for her, but then became a little lighter and heavy again. Now back to light.  She did have clotting but no cramps.   Super tampon and panty liner and changing every hour on the heavy days. Reports she has not missed any cycles.  States she did go longer in between cycle this month at 25 days. Denies any fatigue or dizziness. Current method of birth control is vasectomy.  No change in diet, but a decrease in running secondary to knee pain but she continues to do exercise bike.  No change in work schedule or increase stress.  Not recently SA for past few weeks.   No palpitation, weakness, fatigue, dizzy.  PMH:  BRCA Negative FMH: Breast cancer Mother, MA, Mat cousin X 2 Social:  Good friend just diagnosed with OV cancer age 6  HGB fingerstick: 13.2  O: General:  anxious but in no distress  Heart / lungs:  Normal   Abdomen:  Soft and non tender, no flank pain  Pelvic: No lesions, small amount of bleeding without endo polyp, no mass, no adnexal pain or mass.  A: Perimenopausal   AUB  Plan: Will start her on Provera 10 mg daily X 10 then she should have a withdrawal bleed to coincide with her normal menses. If symptoms not better, worsen, or return then advised a PUS  She is aware that Dr. Quincy Simmonds will review and if other recommendations we soul follow that.  She is comfortable with treatment plan.

## 2015-12-15 NOTE — Progress Notes (Signed)
Encounter reviewed by Dr. Janean Sark. I agree with this plan.

## 2016-03-20 ENCOUNTER — Other Ambulatory Visit: Payer: Self-pay | Admitting: Nurse Practitioner

## 2016-03-20 DIAGNOSIS — R921 Mammographic calcification found on diagnostic imaging of breast: Secondary | ICD-10-CM

## 2016-03-24 ENCOUNTER — Telehealth: Payer: Self-pay | Admitting: Nurse Practitioner

## 2016-03-24 ENCOUNTER — Other Ambulatory Visit: Payer: Self-pay | Admitting: Nurse Practitioner

## 2016-03-24 DIAGNOSIS — R5381 Other malaise: Secondary | ICD-10-CM

## 2016-03-24 DIAGNOSIS — M8580 Other specified disorders of bone density and structure, unspecified site: Secondary | ICD-10-CM

## 2016-03-24 NOTE — Telephone Encounter (Signed)
[facility] needs a "valid diagnosis code" for this patient's bone density order.

## 2016-03-24 NOTE — Telephone Encounter (Signed)
Spoke with [facility]. Order form has been faxed to the office for Ria CommentPatricia Grubb, FNP's review and signature for BMD. Advised I will look for faxed form for completion.

## 2016-03-24 NOTE — Telephone Encounter (Signed)
Spoke with Ria CommentPatricia Grubb, FNP and Judeth CornfieldStephanie. No form for BMD has been received. New order for BMD placed in [facility]. [facility]. Spoke with Cherrish who is agreeable and will contact the patient to schedule her BMD.  Routing to provider for final review. Patient agreeable to disposition. Will close encounter.

## 2016-04-10 ENCOUNTER — Other Ambulatory Visit: Payer: Self-pay | Admitting: Nurse Practitioner

## 2016-04-10 ENCOUNTER — Ambulatory Visit
Admission: RE | Admit: 2016-04-10 | Discharge: 2016-04-10 | Disposition: A | Discharge status: Home / Self Care | Payer: 59 | Source: Ambulatory Visit | Attending: Nurse Practitioner | Admitting: Nurse Practitioner

## 2016-04-10 DIAGNOSIS — R921 Mammographic calcification found on diagnostic imaging of breast: Secondary | ICD-10-CM

## 2016-04-10 DIAGNOSIS — M8580 Other specified disorders of bone density and structure, unspecified site: Secondary | ICD-10-CM

## 2016-05-11 ENCOUNTER — Encounter: Payer: Self-pay | Admitting: Nurse Practitioner

## 2016-05-11 ENCOUNTER — Ambulatory Visit (INDEPENDENT_AMBULATORY_CARE_PROVIDER_SITE_OTHER): Payer: 59 | Admitting: Nurse Practitioner

## 2016-05-11 VITALS — BP 126/78 | HR 84 | Ht 69.0 in | Wt 158.0 lb

## 2016-05-11 DIAGNOSIS — Z0000 Encounter for general adult medical examination without abnormal findings: Secondary | ICD-10-CM

## 2016-05-11 DIAGNOSIS — Z01419 Encounter for gynecological examination (general) (routine) without abnormal findings: Secondary | ICD-10-CM | POA: Diagnosis not present

## 2016-05-11 LAB — POCT URINALYSIS DIPSTICK
Bilirubin, UA: NEGATIVE
Glucose, UA: NEGATIVE
Ketones, UA: NEGATIVE
LEUKOCYTES UA: NEGATIVE
NITRITE UA: NEGATIVE
PH UA: 5
PROTEIN UA: NEGATIVE
RBC UA: NEGATIVE
UROBILINOGEN UA: NEGATIVE

## 2016-05-11 NOTE — Progress Notes (Signed)
Patient ID: Mary Porter, female   DOB: [birth year], 55 y.o.   MRN: [id number]  55 y.o. [id number] Married  Caucasian Fe here for annual exam.  Having fairly regular menses.  This past year menses at 24- 28 day.  No cramp but has associated constipation and takes Miralax.  She had an episode of AUB in January treated with Provera but none since then.   Recent problems with knee pain that has limited her running but now swimming.   Patient's last menstrual period was 04/16/2016 (exact date).          Sexually active: Yes.    The current method of family planning is vasectomy.    Exercising: Yes.    swimming, walking, yoga and pilates Smoker:  no  Health Maintenance: Pap: 04/30/15, negative with neg HR HPV MMG: 07/23/15 with Diagnostic Right on 07/26/15; Bi-Rads 3: Probably Benign; repeat Diagnostic Right on 04/10/16, probably benign; repeat 07/2016 at one year Colonoscopy:  05/10/12, normal, repeat in 5 years BMD: 04/10/16, -0.2 Spine / -0.5 Left Femur Neck TDaP:  03/2014 Hep C with PCP and HIV with pregnancy Labs: HB: PCP   Urine: Negative    reports that she has never smoked. She has never used smokeless tobacco. She reports that she drinks about 4.2 oz of alcohol per week. She reports that she does not use illicit drugs.  Past Medical History  Diagnosis Date  . PMS (premenstrual syndrome)   . Osteopenia   . Cystocele   . Rectocele   . Kidney stone 20's  . BRCA1 negative 03/18/09    [place] of Breast Cancer - mother, MGM, MA, MGA X 2, Mcousin    Past Surgical History  Procedure Laterality Date  . Vaginal delivery      x3  . Colonoscopy      Current Outpatient Prescriptions  Medication Sig Dispense Refill  . aspirin 81 MG tablet Take 81 mg by mouth daily.    Marland Kitchen aspirin-acetaminophen-caffeine (EXCEDRIN MIGRAINE) 250-250-65 MG per tablet Take 1 tablet by mouth every 6 (six) hours as needed for pain.    . calcium-vitamin D (OSCAL WITH D) 250-125 MG-UNIT per tablet Take 1 tablet by mouth daily.     Marland Kitchen ibuprofen (ADVIL,MOTRIN) 200 MG tablet Take 200 mg by mouth every 6 (six) hours as needed for pain.    Marland Kitchen loratadine (CLARITIN) 10 MG tablet Take 10 mg by mouth as needed.     . Multiple Vitamin (MULTIVITAMIN) tablet Take 1 tablet by mouth daily.    . Vitamin D, Ergocalciferol, (DRISDOL) 50000 UNITS CAPS Take 1 capsule (50,000 Units total) by mouth every 7 (seven) days. (Patient not taking: Reported on 05/11/2016) 30 capsule 3   No current facility-administered medications for this visit.    Family History  Problem Relation Age of Onset  . Breast cancer Mother 52    BRCA +  . Breast cancer Maternal Aunt 60    BRCA +  . Cancer - Colon Maternal Grandfather     colon cancer, BRCA +  . Breast cancer Cousin   . Heart disease Father   . Hypertension Father   . Breast cancer Cousin 25    maternal, BRCA -  . Ovarian cancer Cousin 25    dec  . Breast cancer Maternal Aunt     BRCA +  . Breast cancer Maternal Aunt     BRCA +  . Breast cancer Sister     BRCA -    ROS:  Pertinent items are noted in HPI.  Otherwise, a comprehensive ROS was negative.  Exam:   BP 126/78 mmHg  Pulse 84  Ht '5\' 9"'  (1.753 m)  Wt 158 lb (71.668 kg)  BMI 23.32 kg/m2  LMP 04/16/2016 (Exact Date) Height: '5\' 9"'  (175.3 cm) Ht Readings from Last 3 Encounters:  05/11/16 '5\' 9"'  (1.753 m)  12/13/15 '5\' 9"'  (1.753 m)  12/11/15 '5\' 9"'  (1.753 m)    General appearance: alert, cooperative and appears stated age Head: Normocephalic, without obvious abnormality, atraumatic Neck: no adenopathy, supple, symmetrical, trachea midline and thyroid normal to inspection and palpation Lungs: clear to auscultation bilaterally Breasts: normal appearance, no masses or tenderness Heart: regular rate and rhythm Abdomen: soft, non-tender; no masses,  no organomegaly Extremities: extremities normal, atraumatic, no cyanosis or edema Skin: Skin color, texture, turgor normal. No rashes or lesions Lymph nodes: Cervical, supraclavicular,  and axillary nodes normal. No abnormal inguinal nodes palpated Neurologic: Grossly normal   Pelvic: External genitalia:  no lesions              Urethra:  normal appearing urethra with no masses, tenderness or lesions              Bartholin's and Skene's: normal                 Vagina: normal appearing vagina with normal color and discharge, no lesions              Cervix: anteverted              Pap taken: No. Bimanual Exam:  Uterus:  normal size, contour, position, consistency, mobility, non-tender              Adnexa: no mass, fullness, tenderness               Rectovaginal: Confirms               Anus:  normal sphincter tone, no lesions  Chaperone present: no  A:  Well Woman with normal exam  Perimenopausal still with regular menses Marital discord improving  [facility] of breast cancer - pt is BRCA I negative   P:   Reviewed health and wellness pertinent to exam  Pap smear as above  Mammogram is due 07/2016  She will have Vit D done at PCP and then decide on RX or OTC Vit D  Counseled with breast exam, exercise, balanced diet, calcium and Vit D return annually or prn  An After Visit Summary was printed and given to the patient.

## 2016-05-11 NOTE — Patient Instructions (Addendum)

## 2016-05-17 NOTE — Progress Notes (Signed)
Encounter reviewed by Dr. Kadijah Shamoon Amundson C. Silva.  

## 2016-05-21 ENCOUNTER — Encounter: Payer: Self-pay | Admitting: *Deleted

## 2016-06-02 ENCOUNTER — Telehealth: Payer: Self-pay | Admitting: Nurse Practitioner

## 2016-06-02 NOTE — Telephone Encounter (Signed)
Patient was called and given information that we have results of her BRCA testing and she did get only BRCA I and not BRCA II.  She is advised that Dr. Quincy Simmonds and I have reviewed this information with her [facility] and she is advised to pursue testing again.  She tells me that [place] - she will be the only one there who still has breast!  Others with breast cancer and mastectomy.  She has the information about how to make a recheck apt with the cancer clinic and will pursue additional testing.

## 2016-08-03 ENCOUNTER — Telehealth: Payer: Self-pay | Admitting: *Deleted

## 2016-08-03 NOTE — Telephone Encounter (Signed)
Patient is in 04 recall for 07/2016. She needs a Bilateral  DX mammogram. Please contact patient and schedule -eh

## 2016-08-12 NOTE — Telephone Encounter (Signed)
Return call from patient.  Advised patient it is time to schedule mammogram.  Patient declines help scheduling appointment.  She will call to schedule.

## 2016-08-12 NOTE — Telephone Encounter (Signed)
I have attempted to contact this patient by phone with the following results: left message to return my call on answering machine (mobile).  [phone] (Mobile)

## 2016-08-25 NOTE — Telephone Encounter (Signed)
Please follow up with patient. She has not yet scheduled her F/U Mammogram  Thanks

## 2016-09-07 NOTE — Telephone Encounter (Signed)
I spoke with the patient who states she will call and schedule appointment before Wednesday.  I will follow up to make sure patient has scheduled at that time.

## 2016-09-08 ENCOUNTER — Other Ambulatory Visit: Payer: Self-pay | Admitting: Nurse Practitioner

## 2016-09-08 DIAGNOSIS — R921 Mammographic calcification found on diagnostic imaging of breast: Secondary | ICD-10-CM

## 2016-09-09 NOTE — Telephone Encounter (Signed)
Patient is scheduled for Bilateral Diagnostic Mammogram on 09/22/16.

## 2016-09-09 NOTE — Telephone Encounter (Signed)
Patient is scheduled for 09-22-16 - extended recall -eh

## 2016-09-22 ENCOUNTER — Ambulatory Visit
Admission: RE | Admit: 2016-09-22 | Discharge: 2016-09-22 | Disposition: A | Discharge status: Home / Self Care | Payer: 59 | Source: Ambulatory Visit | Attending: Nurse Practitioner | Admitting: Nurse Practitioner

## 2016-09-22 DIAGNOSIS — R921 Mammographic calcification found on diagnostic imaging of breast: Secondary | ICD-10-CM

## 2016-11-27 DIAGNOSIS — R509 Fever, unspecified: Secondary | ICD-10-CM | POA: Diagnosis not present

## 2017-02-22 DIAGNOSIS — B839 Helminthiasis, unspecified: Secondary | ICD-10-CM | POA: Diagnosis not present

## 2017-02-23 ENCOUNTER — Telehealth: Payer: Self-pay

## 2017-02-23 DIAGNOSIS — Z8249 Family history of ischemic heart disease and other diseases of the circulatory system: Secondary | ICD-10-CM | POA: Diagnosis not present

## 2017-02-23 DIAGNOSIS — B839 Helminthiasis, unspecified: Secondary | ICD-10-CM | POA: Diagnosis not present

## 2017-02-23 DIAGNOSIS — R0609 Other forms of dyspnea: Secondary | ICD-10-CM | POA: Diagnosis not present

## 2017-02-23 NOTE — Telephone Encounter (Signed)
SENT NOTES TO SCHEDULING 

## 2017-03-10 ENCOUNTER — Telehealth: Payer: Self-pay | Admitting: *Deleted

## 2017-03-10 NOTE — Telephone Encounter (Signed)
NOTES ON FILE 

## 2017-03-16 ENCOUNTER — Telehealth: Payer: Self-pay | Admitting: Internal Medicine

## 2017-03-16 NOTE — Telephone Encounter (Signed)
Received records from [facility] for appointment on 03/18/17 with Dr Rennis GoldenHilty.  Records put with Dr Blanchie DessertHilty's schedule for 03/18/17. lp

## 2017-03-18 ENCOUNTER — Ambulatory Visit: Payer: 59 | Admitting: Internal Medicine

## 2017-04-30 ENCOUNTER — Telehealth: Payer: Self-pay | Admitting: Nurse Practitioner

## 2017-04-30 NOTE — Telephone Encounter (Signed)
Called patient to reschedule her aex and she will think about it and call back.

## 2017-05-14 ENCOUNTER — Ambulatory Visit: Payer: 59 | Admitting: Nurse Practitioner

## 2017-05-31 ENCOUNTER — Encounter: Payer: Self-pay | Admitting: Obstetrics and Gynecology

## 2017-05-31 ENCOUNTER — Telehealth: Payer: Self-pay | Admitting: Obstetrics & Gynecology

## 2017-05-31 ENCOUNTER — Ambulatory Visit (INDEPENDENT_AMBULATORY_CARE_PROVIDER_SITE_OTHER): Payer: 59 | Admitting: Obstetrics and Gynecology

## 2017-05-31 VITALS — BP 124/70 | HR 68 | Resp 16 | Wt 167.0 lb

## 2017-05-31 DIAGNOSIS — N921 Excessive and frequent menstruation with irregular cycle: Secondary | ICD-10-CM | POA: Diagnosis not present

## 2017-05-31 DIAGNOSIS — R5383 Other fatigue: Secondary | ICD-10-CM

## 2017-05-31 DIAGNOSIS — N926 Irregular menstruation, unspecified: Secondary | ICD-10-CM | POA: Diagnosis not present

## 2017-05-31 DIAGNOSIS — Z803 Family history of malignant neoplasm of breast: Secondary | ICD-10-CM

## 2017-05-31 LAB — POCT URINE PREGNANCY: PREG TEST UR: NEGATIVE

## 2017-05-31 NOTE — Telephone Encounter (Signed)
Patient is having abnormal bleeding. °

## 2017-05-31 NOTE — Progress Notes (Signed)
GYNECOLOGY  VISIT   HPI: 56 y.o.   Married  Caucasian  female   [phone] with Patient's last menstrual period was 05/25/2017.   here c/o irregular menstrual bleeding. She has been having monthly cycles x 5-7 days. It has gotten heavier, lately she has been going through a pad an hour. If it is longer between cycles they are heavier.  Her most recent cycle was a 38 days. She hasn't skipped any cycles completely. No intermenstrual bleeding. She does c/o fatigue. No cramps. Some constipation, no hair loss.  Vasectomy for contraception. No pain. She has some night sweats, but not regularly. She can't run any more secondary to a bad knee. She is gaining weight. When she is on her cycle, she can't even wear a tampon to swim, will bleed around it.  Strong family history of breast cancer. The patient is BRCA negative (some family members are + some are negative). She has a sister and cousin who were both BRCA negative, but had breast cancer.  She last saw a genetic counselor 6-7 years ago.     GYNECOLOGIC HISTORY: Patient's last menstrual period was 05/25/2017. Contraception:vasectomy  Menopausal hormone therapy: none         OB History    Gravida Para Term Preterm AB Living   4 3 0 0 0 3   SAB TAB Ectopic Multiple Live Births   0 0 0 0 3         There are no active problems to display for this patient.   Past Medical History:  Diagnosis Date  . BRCA1 negative 03/18/09   [place] of Breast Cancer - mother, MGM, MA, MGA X 2, Mcousin  . Cystocele   . Kidney stone 20's  . Osteopenia   . PMS (premenstrual syndrome)   . Rectocele     Past Surgical History:  Procedure Laterality Date  . COLONOSCOPY    . VAGINAL DELIVERY     x3    Current Outpatient Prescriptions  Medication Sig Dispense Refill  . aspirin 81 MG tablet Take 81 mg by mouth daily.    Marland Kitchen aspirin-acetaminophen-caffeine (EXCEDRIN MIGRAINE) 250-250-65 MG per tablet Take 1 tablet by mouth every 6 (six) hours as needed for pain.     Marland Kitchen atorvastatin (LIPITOR) 10 MG tablet     . calcium-vitamin D (OSCAL WITH D) 250-125 MG-UNIT per tablet Take 1 tablet by mouth daily.    Marland Kitchen ibuprofen (ADVIL,MOTRIN) 200 MG tablet Take 200 mg by mouth every 6 (six) hours as needed for pain.    Marland Kitchen loratadine (CLARITIN) 10 MG tablet Take 10 mg by mouth as needed.     . Multiple Vitamin (MULTIVITAMIN) tablet Take 1 tablet by mouth daily.     No current facility-administered medications for this visit.      ALLERGIES: Patient has no known allergies.  Family History  Problem Relation Age of Onset  . Breast cancer Mother 64       BRCA +  . Breast cancer Maternal Aunt 60       BRCA +  . Cancer - Colon Maternal Grandfather        colon cancer, BRCA +  . Breast cancer Cousin   . Heart disease Father   . Hypertension Father   . Breast cancer Cousin 25       maternal, BRCA -  . Ovarian cancer Cousin 25       dec  . Breast cancer Maternal Aunt  BRCA +  . Breast cancer Maternal Aunt        BRCA +  . Breast cancer Sister        BRCA -    Social History   Social History  . Marital status: Married    Spouse name: N/A  . Number of children: N/A  . Years of education: N/A   Occupational History  . Not on file.   Social History Main Topics  . Smoking status: Never Smoker  . Smokeless tobacco: Never Used  . Alcohol use 4.2 oz/week    7 Glasses of wine per week  . Drug use: No  . Sexual activity: Yes    Birth control/ protection: Surgical     Comment: vasectomy   Other Topics Concern  . Not on file   Social History Narrative  . No narrative on file    Review of Systems  Constitutional: Negative.   HENT: Negative.   Eyes: Negative.   Respiratory: Negative.   Cardiovascular: Negative.   Gastrointestinal: Negative.   Genitourinary:       Heavy menstrual bleeding Irregular cycles  Musculoskeletal: Negative.   Skin: Negative.   Neurological: Negative.   Endo/Heme/Allergies: Negative.   Psychiatric/Behavioral:  Negative.     PHYSICAL EXAMINATION:    BP 124/70 (BP Location: Right Arm, Patient Position: Sitting, Cuff Size: Normal)   Pulse 68   Resp 16   Wt 167 lb (75.8 kg)   LMP 05/25/2017   BMI 24.66 kg/m     General appearance: alert, cooperative and appears stated age Neck: no adenopathy, supple, symmetrical, trachea midline and thyroid normal to inspection and palpation Abdomen: soft, non-tender; bowel sounds normal; no masses,  no organomegaly  Pelvic: External genitalia:  no lesions              Urethra:  normal appearing urethra with no masses, tenderness or lesions              Bartholins and Skenes: normal                 Vagina: normal appearing vagina with normal color and discharge, no lesions              Cervix: no lesions              Bimanual Exam:  Uterus:  normal size, contour, position, consistency, mobility, non-tender              Adnexa: no mass, fullness, tenderness              Rectovaginal: Yes.  .  Confirms.              Anus:  normal sphincter tone, no lesions  Chaperone was present for exam.  ASSESSMENT AUB/menorrhagia, interfering in her life Family history of breast cancer in both BRCA + and BRCA - relatives, she is BRCA -    PLAN CBC, Ferritin, TSH Return for ultrasound, possible sonohysterogram, likely endometrial biopsy Discussed possible treatments, including: endometrial ablation, mirena IUD, hysterectomy Lysteda may be another option Will look into possible breast MRI    An After Visit Summary was printed and given to the patient.

## 2017-05-31 NOTE — Telephone Encounter (Signed)
Spoke with patient. Patient states that she is having menses that are 38 days apart. During the first 2-3 days of menses she is changing an overnight pad every hour due to bleeding through. After first 2-3 days bleeding decreases. Patient started menses last week and is still having bleeding. Reports bleeding has lessened, but is feeling fatigued. Denies any SOB, weakness, or dizziness. States increased bleeding is alterting her ability to perform daily activities. Requesting to see Dr.Jertson. Previous Ria CommentPatricia Grubb, FNP patient. Appointment scheduled for today at 12:45 pm with Dr.Jertson. Patient is aware Dr.Jertson is in surgery and she will be contacted if there is a delay.  Routing to provider for final review. Patient agreeable to disposition. Will close encounter.

## 2017-06-01 ENCOUNTER — Telehealth: Payer: Self-pay | Admitting: Obstetrics and Gynecology

## 2017-06-01 LAB — CBC
HEMATOCRIT: 36.5 % (ref 34.0–46.6)
Hemoglobin: 12.2 g/dL (ref 11.1–15.9)
MCH: 29 pg (ref 26.6–33.0)
MCHC: 33.4 g/dL (ref 31.5–35.7)
MCV: 87 fL (ref 79–97)
PLATELETS: 242 10*3/uL (ref 150–379)
RBC: 4.21 x10E6/uL (ref 3.77–5.28)
RDW: 14.3 % (ref 12.3–15.4)
WBC: 4.4 10*3/uL (ref 3.4–10.8)

## 2017-06-01 LAB — TSH: TSH: 1.78 u[IU]/mL (ref 0.450–4.500)

## 2017-06-01 LAB — FERRITIN: Ferritin: 26 ng/mL (ref 15–150)

## 2017-06-01 NOTE — Telephone Encounter (Signed)
Call placed to patient to review benefits for a recommended procedure. Left voicemail message requesting a return call. °

## 2017-06-02 NOTE — Telephone Encounter (Signed)
Patient returned call. Reviewed benefit information and answered questions. Patient states she does not wish to move forward scheduling at this time.   Routing to provider for review.

## 2017-06-03 NOTE — Telephone Encounter (Signed)
She isn't anemic, so I think if she declines evaluation that is her choice. Please tell her to try taking ibuprofen around the clock on her heavy days, it may help decrease her flow. If she would like it, please call in 800 mg ibuprofen q 8 hours prn. #30, 1 refill  CC: Harland DingwallSuzy Dixon, Triage pool to discuss the ibuprofen.

## 2017-06-04 NOTE — Telephone Encounter (Signed)
Spoke with patient, advised as seen below per Dr. Oscar LaJertson. Patient declined RX for ibuprofen at this time, will take OTC. Aware to return call to office for any additional questions. Patient verbalizes understanding.     Patient is agreeable to disposition. Will close encounter.

## 2017-06-25 ENCOUNTER — Telehealth: Payer: Self-pay | Admitting: Obstetrics and Gynecology

## 2017-06-25 DIAGNOSIS — Z803 Family history of malignant neoplasm of breast: Secondary | ICD-10-CM

## 2017-06-25 NOTE — Telephone Encounter (Signed)
Please let the patient know that I reached out to the [facility] and she recommended that she come back for re-evaluation. Please set this up for her.

## 2017-06-25 NOTE — Telephone Encounter (Signed)
-----  Message from Tana Felts sent at 06/04/2017 11:59 AM EDT ----- Hello Dr. Talbert Nan, Thank you for asking about Ms. Panuco.  From records we have her genetic testing was in 2010, so it has been a while and there is updated testing.  Because there is some breast cancer in the family that is not explained by the familial BRCA1 mutation, it would be good for [place] to re-evaluate her.   You  Mentioned that her sister and cousin's breast cancer was PMP, I wanted to verify;  did you mean post-menopausal?  There is a cousin entered in the family history section who had breast cancer at 65 and was BRCA1-.    Thank you! Ria Comment  ----- Message ----- From: Salvadore Dom, MD Sent: 06/03/2017   6:58 PM To: Tana Felts  Willette Brace, This patient states that she was seen at the genetics clinic 6-7 years ago. She has a strong family history of breast cancer. Some of her family members are BRCA +, she is BRCA -. She does have a sister and a cousin who are BRCA- who have had breast cancer.  I'm wondering if I need to send her back for evaluation or if you can help me determine if she should be getting breast MRI's because of the non BRCA relatives with breast cancer (I believe they were both PMP). I know this is a little confusing. Thanks for you help! Sharee Pimple

## 2017-06-25 NOTE — Telephone Encounter (Signed)
Spoke with patient. Advised of message as seen below from Dr.Jertson. Patient is agreeable and would like to return to see genetics. New referral placed for patient to return to [facility] for re-evaluation. Aware she will be contacted to schedule appointment date and time.  Cc: Braxton Feathers for scheduling.  Routing to provider for final review. Patient agreeable to disposition. Will close encounter.

## 2017-08-09 ENCOUNTER — Other Ambulatory Visit: Payer: Self-pay | Admitting: Obstetrics and Gynecology

## 2017-08-09 DIAGNOSIS — R921 Mammographic calcification found on diagnostic imaging of breast: Secondary | ICD-10-CM

## 2017-09-28 ENCOUNTER — Ambulatory Visit
Admission: RE | Admit: 2017-09-28 | Discharge: 2017-09-28 | Disposition: A | Discharge status: Home / Self Care | Payer: 59 | Source: Ambulatory Visit | Attending: Obstetrics and Gynecology | Admitting: Obstetrics and Gynecology

## 2017-09-28 DIAGNOSIS — R921 Mammographic calcification found on diagnostic imaging of breast: Secondary | ICD-10-CM

## 2017-09-30 ENCOUNTER — Telehealth: Payer: Self-pay

## 2017-09-30 DIAGNOSIS — Z803 Family history of malignant neoplasm of breast: Secondary | ICD-10-CM

## 2017-09-30 NOTE — Telephone Encounter (Signed)
Spoke with patient. Advised of message as seen below from Dr.Jertson. Patient would like referral for genetics counseling before proceeding with Breast MRI. [facility] Aware [facility].  Routing to provider for final review. Patient agreeable to disposition. Will close encounter.

## 2017-09-30 NOTE — Telephone Encounter (Signed)
-----   Message from Romualdo BolkJill Evelyn Jertson, MD sent at 09/29/2017  5:16 PM EST ----- The patient has a strong FH of breast cancer. She hasn't been seen by a geneticist in years. The Geneticist recommended she return for f/u and further recommendations. Please check with the patient if she is going to do this. The radiologist discussed MRI's with her. It would be nice to get the opinion of the genetic counselor.

## 2017-10-15 ENCOUNTER — Telehealth: Payer: Self-pay | Admitting: Genetic Counselor

## 2017-10-15 NOTE — Telephone Encounter (Signed)
10/15/17 left message for patient to call back for appointment with Maylon CosKaren Powell on 11/22/17 @ 1:00 pm

## 2017-10-21 ENCOUNTER — Telehealth: Payer: Self-pay | Admitting: Genetic Counselor

## 2017-10-21 NOTE — Telephone Encounter (Signed)
10/21/17 Called and spoke with patient @ 10:21 am to confirm her Genetic counseling appointment with Maylon CosKaren Powell on 11/22/17 @ 1:00 pm.  Patient is aware to arrive 30 minutes prior to register.

## 2017-11-08 DIAGNOSIS — R7301 Impaired fasting glucose: Secondary | ICD-10-CM | POA: Diagnosis not present

## 2017-11-08 DIAGNOSIS — Z23 Encounter for immunization: Secondary | ICD-10-CM | POA: Diagnosis not present

## 2017-11-08 DIAGNOSIS — E785 Hyperlipidemia, unspecified: Secondary | ICD-10-CM | POA: Diagnosis not present

## 2017-11-08 DIAGNOSIS — Z0000 Encounter for general adult medical examination without abnormal findings: Secondary | ICD-10-CM | POA: Diagnosis not present

## 2017-11-08 DIAGNOSIS — E559 Vitamin D deficiency, unspecified: Secondary | ICD-10-CM | POA: Diagnosis not present

## 2017-11-22 ENCOUNTER — Inpatient Hospital Stay: Payer: 59 | Attending: Genetic Counselor | Admitting: Genetic Counselor

## 2017-11-22 ENCOUNTER — Encounter: Payer: Self-pay | Admitting: Genetic Counselor

## 2017-11-22 ENCOUNTER — Inpatient Hospital Stay: Payer: 59

## 2017-11-22 DIAGNOSIS — Z8481 Family history of carrier of genetic disease: Secondary | ICD-10-CM | POA: Diagnosis not present

## 2017-11-22 DIAGNOSIS — Z803 Family history of malignant neoplasm of breast: Secondary | ICD-10-CM | POA: Diagnosis not present

## 2017-11-22 DIAGNOSIS — Z8041 Family history of malignant neoplasm of ovary: Secondary | ICD-10-CM | POA: Diagnosis not present

## 2017-11-22 NOTE — Progress Notes (Addendum)
REFERRING PROVIDER: Salvadore Dom, [address] [place], [place] [address]  PRIMARY PROVIDER:  Harlan Stains, MD  PRIMARY REASON FOR VISIT:  1. Family history of breast cancer   2. Family history of BRCA1 gene positive   3. Family history of ovarian cancer      HISTORY OF PRESENT ILLNESS:   Mary Porter, a 57 y.o. female, was seen for a [facility] cancer genetics consultation at the request of Dr. Talbert Nan due to a family history of cancer.  Ms. Maciolek presents to clinic today to discuss the possibility of a hereditary predisposition to cancer, genetic testing, and to further clarify her future cancer risks, as well as potential cancer risks for family members.  Ms. Kuyper is a 57 y.o. female with no personal history of cancer.  She has a strong family history of breast cancer, which is mostly explained by a known BRCA1 mutation.  The patient was tested for single site BRCA1 in 2010 and was negative.  Her sister is also negative, but developed breast cancer in her 33's.  Ms. Badman is interested in her risk for breast cancer and information about updated testing.  CANCER HISTORY:   No history exists.     HORMONAL RISK FACTORS:  Menarche was at age 80.  First live birth at age 26.  OCP use for approximately <1 years.  Ovaries intact: yes.  Hysterectomy: no.  Menopausal status: premenopausal.  HRT use: 0 years. Colonoscopy: yes; normal. Mammogram within the last year: yes. Number of breast biopsies: 0. Up to date with pelvic exams:  yes. Any excessive radiation exposure in the past:  no  Past Medical History:  Diagnosis Date  . BRCA1 negative 03/18/09   [facility] - mother, MGM, MA, MGA X 2, Mcousin  . Cystocele   . Family history of BRCA1 gene positive   . Family history of breast cancer   . Family history of ovarian cancer   . Kidney stone 20's  . Osteopenia   . PMS (premenstrual syndrome)   . Rectocele     Past Surgical History:  Procedure  Laterality Date  . COLONOSCOPY    . VAGINAL DELIVERY     x3    Social History   Socioeconomic History  . Marital status: Married    Spouse name: Not on file  . Number of children: Not on file  . Years of education: Not on file  . Highest education level: Not on file  Social Needs  . Financial resource strain: Not on file  . Food insecurity - worry: Not on file  . Food insecurity - inability: Not on file  . Transportation needs - medical: Not on file  . Transportation needs - non-medical: Not on file  Occupational History  . Not on file  Tobacco Use  . Smoking status: Never Smoker  . Smokeless tobacco: Never Used  Substance and Sexual Activity  . Alcohol use: Yes    Alcohol/week: 4.2 oz    Types: 7 Glasses of wine per week  . Drug use: No  . Sexual activity: Yes    Birth control/protection: Surgical    Comment: vasectomy  Other Topics Concern  . Not on file  Social History Narrative  . Not on file     FAMILY HISTORY:  We obtained a detailed, 4-generation family history.  Significant diagnoses are listed below: Family History  Problem Relation Age of Onset  . Breast cancer Mother 84  BRCA +  . BRCA 1/2 Mother        BRCA1 Pos  . Breast cancer Maternal Aunt 60       BRCA +  . BRCA 1/2 Maternal Aunt        BRCA1 pos  . Cancer - Colon Maternal Grandfather        colon cancer, BRCA +  . BRCA 1/2 Maternal Grandfather        BRCA1 pos  . Heart disease Father   . Hypertension Father   . Breast cancer Cousin 25       maternal, BRCA -  . BRCA 1/2 Cousin        BRCA Neg  . Ovarian cancer Cousin 25       dec  . Breast cancer Maternal Aunt        BRCA +  . BRCA 1/2 Maternal Aunt        BRCA1 pos  . Breast cancer Maternal Aunt        BRCA +  . BRCA 1/2 Maternal Aunt        BRCA1 pos  . Breast cancer Sister        BRCA -  . BRCA 1/2 Sister        BRCA NEG  . BRCA 1/2 Brother        BRCA1 pos  . Heart disease Paternal Grandfather   . BRCA 1/2 Sister         BRCA Pos    The patient has a son and two daughters who are cancer free.  She has a brother and two sisters.  One sister and her brother are BRCA1 pos.  The sister who is BRCA1 neg developed breast cancer at age 1.  The patient's father is deceased, but her mother is living.  The patient's mother developed breast cancer in her 79's and is BRCA1 pos.  She had four sisters.  One sister died of a brain aneurysm at 72.  The other three sisters had breast cancer in their 67's-60s and are BRCA1 pos.  One sister had a daughter who had ovarian cancer in her 51's and died.  Another sister has a daughter who is BRCA neg but developed breast cancer.  Both maternal grandparents are deceased. The grandfather died of colon cancer.  He had two sisters with bilateral breast cancer.  The patient's father died of complications of dementia.  He was an only child.  His parents are both deceased, his father died in his 85's from a heart attack and his mother died at 18.  Patient's maternal ancestors are of [place], [place] descent, and paternal ancestors are of [place] and [place] descent. There is no reported Ashkenazi Jewish ancestry. There is no known consanguinity.  GENETIC COUNSELING ASSESSMENT: KAYSI OURADA is a 57 y.o. female with a family history of breast cancer and a known family mutation which is somewhat suggestive of a hereditary breast and ovarian cancer syndrome and predisposition to cancer. We, therefore, discussed and recommended the following at today's visit.   DISCUSSION: We discussed that about 5-10% of breast cancer is hereditary with most cases due to BRCA mutations.  About 6% of families with one hereditary mutation have a second mutation as well.  Therefore, this could explain why her sister and cousin developed breast cancer even when gene negative.  We also discussed that they could be phenocopies, and developed breast cancer for other reasons.  We reviewed the characteristics,  features and  inheritance patterns of hereditary cancer syndromes. We also discussed genetic testing, including the appropriate family members to test, the process of testing, insurance coverage and turn-around-time for results. We discussed the implications of a negative, positive and/or variant of uncertain significant result. We recommended Ms. Muckleroy pursue genetic testing for the hereditary common cancer gene panel. The Hereditary Gene Panel offered by [facility] includes sequencing and/or deletion duplication testing of the following 47 genes: APC, ATM, AXIN2, BARD1, BMPR1A, BRCA1, BRCA2, BRIP1, CDH1, CDK4, CDKN2A (p14ARF), CDKN2A (p16INK4a), CHEK2, CTNNA1, DICER1, EPCAM (Deletion/duplication testing only), GREM1 (promoter region deletion/duplication testing only), KIT, MEN1, MLH1, MSH2, MSH3, MSH6, MUTYH, NBN, NF1, NHTL1, PALB2, PDGFRA, PMS2, POLD1, POLE, PTEN, RAD50, RAD51C, RAD51D, SDHB, SDHC, SDHD, SMAD4, SMARCA4. STK11, TP53, TSC1, TSC2, and VHL.  The following genes were evaluated for sequence changes only: SDHA and HOXB13 c.251G>A variant only.   We discussed that some people do not want to undergo genetic testing due to fear of genetic discrimination.  A federal law called the Genetic Information Non-Discrimination Act (GINA) of 2008 helps protect individuals against genetic discrimination based on their genetic test results.  It impacts both health insurance and employment.  With health insurance, it protects against increased premiums, being kicked off insurance or being forced to take a test in order to be insured.  For employment it protects against hiring, firing and promoting decisions based on genetic test results.  Health status due to a cancer diagnosis is not protected under GINA.  Based on Ms. Hechler's family history of cancer, she meets medical criteria for genetic testing. Despite that she meets criteria, she may still have an out of pocket cost. We discussed that if her out of pocket cost  for testing is over $100, the laboratory will call and confirm whether she wants to proceed with testing.  If the out of pocket cost of testing is less than $100 she will be billed by the genetic testing laboratory.   Based on the patient's personal and family history, statistical models (Tyrer Cusik)  and literature data were used to estimate her risk of developing breast cancer. This estimates her lifetime risk of developing breast cancer to be approximately 12.4%. This estimation takes into account the genetic testing results of her family members as well as her previous negative single site testing.  The patient's lifetime breast cancer risk is a preliminary estimate based on available information using one of several models endorsed by the [employer] (ACS). The ACS recommends consideration of breast MRI screening as an adjunct to mammography for patients at high risk (defined as 20% or greater lifetime risk). A more detailed breast cancer risk assessment can be considered, if clinically indicated.      PLAN: Despite our recommendation, Ms. Dhillon did not wish to pursue genetic testing at today's visit. We understand this decision, and remain available to coordinate genetic testing at any time in the future. We; therefore, recommend Ms. Bogucki continue to follow the cancer screening guidelines given by her primary healthcare provider.  Based on Ms. Hesser's family history, we recommended her sister Lelon Frohlich, who was diagnosed with breast cancer at age 65, have genetic counseling and repeat testing. Ms. Kiddy will let us know if we can be of any assistance in coordinating genetic counseling and/or testing for this family member.   Lastly, we encouraged Ms. Troiani to remain in contact with cancer genetics annually so that we can continuously update the family history and inform her of any changes in cancer  genetics and testing that may be of benefit for this family.   Ms.  Kittle questions were  answered to her satisfaction today. Our contact information was provided should additional questions or concerns arise. Thank you for the referral and allowing [place] to share in the care of your patient.   Karen P. Florene Glen, [place], [facility] Certified Genetic Counselor [email or web address] phone: [phone]  The patient was seen for a total of 60 minutes in face-to-face genetic counseling.  This patient was discussed with Drs. Magrinat, Lindi Adie and/or Burr Medico who agrees with the above.    _______________________________________________________________________ For Office Staff:  Number of people involved in session: 1 Was an Intern/ student involved with case: no

## 2017-12-31 ENCOUNTER — Telehealth: Payer: Self-pay | Admitting: Obstetrics and Gynecology

## 2017-12-31 DIAGNOSIS — N920 Excessive and frequent menstruation with regular cycle: Secondary | ICD-10-CM

## 2017-12-31 NOTE — Telephone Encounter (Signed)
Patient called and said she is interested in getting an ablation.

## 2018-01-06 DIAGNOSIS — L570 Actinic keratosis: Secondary | ICD-10-CM | POA: Diagnosis not present

## 2018-01-06 NOTE — Telephone Encounter (Signed)
Telephone note not routed in error. Routing to nursing supervisor, Kennon RoundsSally, to follow up with patient.

## 2018-01-07 NOTE — Telephone Encounter (Signed)
Call to patient. Apologized for delay in return call.  Patient reports still has heavy cycle monthly at 57 years old.  Require 2 tampons and large pad to leave the house.  Patient is very active and menses interferes with activity for a full week.  Desires to proceed with evaluation and endometrial ablation. Advised will need PUS/poss SHGM/poss endometrial bx and consult with Dr Oscar LaJertson. (See previous office note from 05-31-17) Appointment scheduled for 01-11-18. LMP 1 week ago. Vasectomy. Instructed to take Motrin 800 mg one hour prior with food. Advised Dr Oscar LaJertson will review call and we will call her back if any changes.    Routing to provider for final review. Patient agreeable to disposition. Will close encounter.

## 2018-01-11 ENCOUNTER — Encounter: Payer: Self-pay | Admitting: Obstetrics and Gynecology

## 2018-01-11 ENCOUNTER — Other Ambulatory Visit: Payer: Self-pay | Admitting: Obstetrics and Gynecology

## 2018-01-11 ENCOUNTER — Ambulatory Visit (INDEPENDENT_AMBULATORY_CARE_PROVIDER_SITE_OTHER): Payer: 59

## 2018-01-11 ENCOUNTER — Other Ambulatory Visit: Payer: Self-pay

## 2018-01-11 ENCOUNTER — Ambulatory Visit (INDEPENDENT_AMBULATORY_CARE_PROVIDER_SITE_OTHER): Payer: 59 | Admitting: Obstetrics and Gynecology

## 2018-01-11 VITALS — BP 132/72 | HR 80 | Resp 12 | Wt 167.1 lb

## 2018-01-11 DIAGNOSIS — N920 Excessive and frequent menstruation with regular cycle: Secondary | ICD-10-CM | POA: Diagnosis not present

## 2018-01-11 NOTE — Patient Instructions (Signed)

## 2018-01-11 NOTE — Progress Notes (Signed)
GYNECOLOGY  VISIT   HPI: 57 y.o.   Married  Caucasian  female   [phone] with Patient's last menstrual period was 12/21/2017.   here for further evaluation of menorrhagia. She cycles every 28-35 days, saturates a pad an hour. Can't swim because she bleeds through the tampon. Normal TSH, CBC and ferritin in 7/18. She wants to do something about this bleeding.  She has a strong FH of breast cancer, she recently saw the genetic testing, declined further testing, previously with negative BRCA 1 testing. Based on the cancer assessment tool she had a 12% lifetime risk of cancer.   GYNECOLOGIC HISTORY: Patient's last menstrual period was 12/21/2017. Contraception:Vasectomy Menopausal hormone therapy: NA        OB History    Gravida Para Term Preterm AB Living   4 3 0 0 0 3   SAB TAB Ectopic Multiple Live Births   0 0 0 0 3         Patient Active Problem List   Diagnosis Date Noted  . Family history of breast cancer   . Family history of BRCA1 gene positive   . Family history of ovarian cancer     Past Medical History:  Diagnosis Date  . BRCA1 negative 03/18/09   [facility] - mother, MGM, MA, MGA X 2, Mcousin  . Cystocele   . Family history of BRCA1 gene positive   . Family history of breast cancer   . Family history of ovarian cancer   . Kidney stone 20's  . Osteopenia   . PMS (premenstrual syndrome)   . Rectocele     Past Surgical History:  Procedure Laterality Date  . COLONOSCOPY    . VAGINAL DELIVERY     x3    Current Outpatient Medications  Medication Sig Dispense Refill  . aspirin 81 MG tablet Take 81 mg by mouth daily.    Marland Kitchen aspirin-acetaminophen-caffeine (EXCEDRIN MIGRAINE) 250-250-65 MG per tablet Take 1 tablet by mouth every 6 (six) hours as needed for pain.    Marland Kitchen atorvastatin (LIPITOR) 10 MG tablet     . calcium-vitamin D (OSCAL WITH D) 250-125 MG-UNIT per tablet Take 1 tablet by mouth daily.    Marland Kitchen ibuprofen (ADVIL,MOTRIN) 200 MG tablet Take 200 mg by  mouth every 6 (six) hours as needed for pain.    Marland Kitchen loratadine (CLARITIN) 10 MG tablet Take 10 mg by mouth as needed.     . Multiple Vitamin (MULTIVITAMIN) tablet Take 1 tablet by mouth daily.    . VENTOLIN HFA 108 (90 Base) MCG/ACT inhaler inhale 2 puffs by mouth every 4 hours if needed for DYSPNEA AND 2 PUFFS PRIOR TO EXERCISE  0   No current facility-administered medications for this visit.      ALLERGIES: Patient has no known allergies.  Family History  Problem Relation Age of Onset  . Breast cancer Mother 89       BRCA +  . BRCA 1/2 Mother        BRCA1 Pos  . Breast cancer Maternal Aunt 60       BRCA +  . BRCA 1/2 Maternal Aunt        BRCA1 pos  . Cancer - Colon Maternal Grandfather        colon cancer, BRCA +  . BRCA 1/2 Maternal Grandfather        BRCA1 pos  . Heart disease Father   . Hypertension Father   . Breast cancer Cousin 25  maternal, BRCA -  . BRCA 1/2 Cousin        BRCA Neg  . Ovarian cancer Cousin 25       dec  . Breast cancer Maternal Aunt        BRCA +  . BRCA 1/2 Maternal Aunt        BRCA1 pos  . Breast cancer Maternal Aunt        BRCA +  . BRCA 1/2 Maternal Aunt        BRCA1 pos  . Breast cancer Sister        BRCA -  . BRCA 1/2 Sister        BRCA NEG  . BRCA 1/2 Brother        BRCA1 pos  . Heart disease Paternal Grandfather   . BRCA 1/2 Sister        BRCA Pos    Social History   Socioeconomic History  . Marital status: Married    Spouse name: Not on file  . Number of children: Not on file  . Years of education: Not on file  . Highest education level: Not on file  Social Needs  . Financial resource strain: Not on file  . Food insecurity - worry: Not on file  . Food insecurity - inability: Not on file  . Transportation needs - medical: Not on file  . Transportation needs - non-medical: Not on file  Occupational History  . Not on file  Tobacco Use  . Smoking status: Never Smoker  . Smokeless tobacco: Never Used  Substance  and Sexual Activity  . Alcohol use: Yes    Alcohol/week: 4.2 oz    Types: 7 Glasses of wine per week  . Drug use: No  . Sexual activity: Yes    Birth control/protection: Surgical    Comment: vasectomy  Other Topics Concern  . Not on file  Social History Narrative  . Not on file    Review of Systems  Eyes: Negative for blurred vision.  All other systems reviewed and are negative.   PHYSICAL EXAMINATION:    BP 132/72 (BP Location: Right Arm, Patient Position: Sitting, Cuff Size: Normal)   Pulse 80   Resp 12   Wt 167 lb 1.6 oz (75.8 kg)   LMP 12/21/2017   BMI 24.68 kg/m     General appearance: alert, cooperative and appears stated age  Pelvic: External genitalia:  no lesions              Urethra:  normal appearing urethra with no masses, tenderness or lesions              Bartholins and Skenes: normal                 Vagina: normal appearing vagina with normal color and discharge, no lesions              Cervix: no lesions  The risks of endometrial biopsy were reviewed and a consent was obtained.  A speculum was placed in the vagina and the cervix was cleansed with betadine. A mini-pipelle was placed into the endometrial cavity. The uterus sounded to 10 cm. The endometrial biopsy was performed, moderate tissue was obtained. The tenaculum and speculum were removed. There were no complications.   Chaperone was present for exam.  ASSESSMENT Menorrhagia, normal labs Ultrasound looks c/w adenomyosis    PLAN Endometrial biopsy done Discussed options: do nothing, mirena IUD, hormonal treatment, endometrial ablation and hysterectomy Recommend  mirena IUD, she is in agreement  Return on her cycle for mirena IUD insertion (would like to do towards the end of her cycle because of her very heavy bleeding) Discussed risks of mirena IUD insertion and side effects   An After Visit Summary was printed and given to the patient.  ~15 minutes face to face time of which over 50% was  spent in counseling.

## 2018-01-24 ENCOUNTER — Telehealth: Payer: Self-pay | Admitting: Obstetrics and Gynecology

## 2018-01-24 NOTE — Telephone Encounter (Signed)
Spoke with patient. Appointment scheduled for 4/1 at 4:15 pm with Dr.Jertson. Patient is agreeable to date and time. Pre procedure instructions given.  Motrin instructions given. Motrin=Advil=Ibuprofen, 800 mg one hour before appointment. Eat a meal and hydrate well before appointment.  Encounter closed.

## 2018-01-24 NOTE — Telephone Encounter (Signed)
Spoke with patient. Patient states that she started her menses yesterday. Reports she was advised to have IUD placed at the end of her menses due to heavy bleeding. Patient will be going out of town starting 3/28. Asking to have IUD placed 4/1/209. Advised will review with MD regarding scheduling and return call. Patient is agreeable.  Dr.Jertson, you will be in the OR the morning of 4/1. No openings. Please advise.

## 2018-01-24 NOTE — Telephone Encounter (Signed)
Please add her at 4:15 on 01/31/18

## 2018-01-24 NOTE — Telephone Encounter (Signed)
Patient is ready to schedule her IUD insertion appointment. Patient would like next Monday 01/31/18 if possible.

## 2018-01-31 ENCOUNTER — Other Ambulatory Visit: Payer: Self-pay

## 2018-01-31 ENCOUNTER — Ambulatory Visit (INDEPENDENT_AMBULATORY_CARE_PROVIDER_SITE_OTHER): Payer: 59 | Admitting: Obstetrics and Gynecology

## 2018-01-31 ENCOUNTER — Encounter: Payer: Self-pay | Admitting: Obstetrics and Gynecology

## 2018-01-31 VITALS — BP 156/96 | HR 72 | Resp 12 | Wt 168.0 lb

## 2018-01-31 DIAGNOSIS — R030 Elevated blood-pressure reading, without diagnosis of hypertension: Secondary | ICD-10-CM | POA: Diagnosis not present

## 2018-01-31 DIAGNOSIS — Z30430 Encounter for insertion of intrauterine contraceptive device: Secondary | ICD-10-CM | POA: Diagnosis not present

## 2018-01-31 DIAGNOSIS — N920 Excessive and frequent menstruation with regular cycle: Secondary | ICD-10-CM

## 2018-01-31 DIAGNOSIS — Z01812 Encounter for preprocedural laboratory examination: Secondary | ICD-10-CM | POA: Diagnosis not present

## 2018-01-31 HISTORY — PX: INTRAUTERINE DEVICE (IUD) INSERTION: SHX5877

## 2018-01-31 LAB — POCT URINE PREGNANCY: Preg Test, Ur: NEGATIVE

## 2018-01-31 NOTE — Progress Notes (Signed)
GYNECOLOGY  VISIT   HPI: 57 y.o.   Married  Caucasian  female   [phone] with Patient's last menstrual period was 01/23/2018.   here for Mirena IUD insertion for menorrhagia, u/s c/w adenomyosis. Normal endometrial biopsy.   Daughter is going to Sports coach school. Husband is Pediatric Ophthalmologist.     GYNECOLOGIC HISTORY: Patient's last menstrual period was 01/23/2018. Contraception:vasectomy  Menopausal hormone therapy: none         OB History    Gravida  4   Para  3   Term  0   Preterm  0   AB  0   Living  3     SAB  0   TAB  0   Ectopic  0   Multiple  0   Live Births  3              Patient Active Problem List   Diagnosis Date Noted  . Family history of breast cancer   . Family history of BRCA1 gene positive   . Family history of ovarian cancer     Past Medical History:  Diagnosis Date  . BRCA1 negative 03/18/09   [place] of Breast Cancer - mother, MGM, MA, MGA X 2, Mcousin  . Cystocele   . Family history of BRCA1 gene positive   . Family history of breast cancer   . Family history of ovarian cancer   . Kidney stone 20's  . Osteopenia   . PMS (premenstrual syndrome)   . Rectocele     Past Surgical History:  Procedure Laterality Date  . COLONOSCOPY    . VAGINAL DELIVERY     x3    Current Outpatient Medications  Medication Sig Dispense Refill  . aspirin 81 MG tablet Take 81 mg by mouth daily.    Marland Kitchen aspirin-acetaminophen-caffeine (EXCEDRIN MIGRAINE) 250-250-65 MG per tablet Take 1 tablet by mouth every 6 (six) hours as needed for pain.    Marland Kitchen atorvastatin (LIPITOR) 10 MG tablet     . calcium-vitamin D (OSCAL WITH D) 250-125 MG-UNIT per tablet Take 1 tablet by mouth daily.    Marland Kitchen ibuprofen (ADVIL,MOTRIN) 200 MG tablet Take 200 mg by mouth every 6 (six) hours as needed for pain.    Marland Kitchen loratadine (CLARITIN) 10 MG tablet Take 10 mg by mouth as needed.     . Multiple Vitamin (MULTIVITAMIN) tablet Take 1 tablet by mouth daily.    . VENTOLIN HFA 108 (90 Base)  MCG/ACT inhaler inhale 2 puffs by mouth every 4 hours if needed for DYSPNEA AND 2 PUFFS PRIOR TO EXERCISE  0   No current facility-administered medications for this visit.      ALLERGIES: Patient has no known allergies.  Family History  Problem Relation Age of Onset  . Breast cancer Mother 30       BRCA +  . BRCA 1/2 Mother        BRCA1 Pos  . Breast cancer Maternal Aunt 60       BRCA +  . BRCA 1/2 Maternal Aunt        BRCA1 pos  . Cancer - Colon Maternal Grandfather        colon cancer, BRCA +  . BRCA 1/2 Maternal Grandfather        BRCA1 pos  . Heart disease Father   . Hypertension Father   . Breast cancer Cousin 25       maternal, BRCA -  . BRCA 1/2 Cousin  BRCA Neg  . Ovarian cancer Cousin 25       dec  . Breast cancer Maternal Aunt        BRCA +  . BRCA 1/2 Maternal Aunt        BRCA1 pos  . Breast cancer Maternal Aunt        BRCA +  . BRCA 1/2 Maternal Aunt        BRCA1 pos  . Breast cancer Sister        BRCA -  . BRCA 1/2 Sister        BRCA NEG  . BRCA 1/2 Brother        BRCA1 pos  . Heart disease Paternal Grandfather   . BRCA 1/2 Sister        BRCA Pos    Social History   Socioeconomic History  . Marital status: Married    Spouse name: Not on file  . Number of children: Not on file  . Years of education: Not on file  . Highest education level: Not on file  Occupational History  . Not on file  Social Needs  . Financial resource strain: Not on file  . Food insecurity:    Worry: Not on file    Inability: Not on file  . Transportation needs:    Medical: Not on file    Non-medical: Not on file  Tobacco Use  . Smoking status: Never Smoker  . Smokeless tobacco: Never Used  Substance and Sexual Activity  . Alcohol use: Yes    Alcohol/week: 4.2 oz    Types: 7 Glasses of wine per week  . Drug use: No  . Sexual activity: Yes    Birth control/protection: Surgical    Comment: vasectomy  Lifestyle  . Physical activity:    Days per week:  Not on file    Minutes per session: Not on file  . Stress: Not on file  Relationships  . Social connections:    Talks on phone: Not on file    Gets together: Not on file    Attends religious service: Not on file    Active member of club or organization: Not on file    Attends meetings of clubs or organizations: Not on file    Relationship status: Not on file  . Intimate partner violence:    Fear of current or ex partner: Not on file    Emotionally abused: Not on file    Physically abused: Not on file    Forced sexual activity: Not on file  Other Topics Concern  . Not on file  Social History Narrative  . Not on file    Review of Systems  Constitutional: Negative.   HENT: Negative.   Eyes: Negative.   Respiratory: Negative.   Cardiovascular: Negative.   Gastrointestinal: Negative.   Genitourinary: Negative.   Musculoskeletal: Negative.   Skin: Negative.   Neurological: Negative.   Endo/Heme/Allergies: Negative.   Psychiatric/Behavioral: Negative.     PHYSICAL EXAMINATION:    BP (!) 156/96 (BP Location: Right Arm, Patient Position: Sitting, Cuff Size: Normal)   Pulse 72   Resp 12   Wt 168 lb (76.2 kg)   LMP 01/23/2018   BMI 24.81 kg/m     General appearance: alert, cooperative and appears stated age  Pelvic: External genitalia:  no lesions              Urethra:  normal appearing urethra with no masses, tenderness or lesions  Bartholins and Skenes: normal                 Vagina: normal appearing vagina with normal color and discharge, no lesions              Cervix: no lesions                The risks of the mirena IUD were reviewed with the patient, including infection, abnormal bleeding and uterine perfortion. Consent was signed.  A speculum was placed in the vagina, the cervix was cleansed with betadine. A tenaculum was placed on the cervix, the uterus sounded to 10 cm. The cervix was easily dilated to a 5 hagar dilator  The mirena IUD was  inserted without difficulty. The string were cut to 3-4 cm. The tenaculum was removed. Slight oozing from the tenaculum site was stopped with pressure.   The patient tolerated the procedure well.   Chaperone was present for exam.  ASSESSMENT Menorrhagia, likely adenomyosis. Normal endometrial biopsy, normal TSH and CBC.  Elevated BP x 2 (same reading) Weight gain   PLAN Mirena IUD placed F/U in one month She is going to check her BP at home and f/u with her primary MD We discussed weight loss, including exercise, weight watchers, not drinking her calories, portion control   An After Visit Summary was printed and given to the patient.

## 2018-01-31 NOTE — Patient Instructions (Signed)

## 2018-03-01 ENCOUNTER — Encounter: Payer: Self-pay | Admitting: Obstetrics and Gynecology

## 2018-03-01 ENCOUNTER — Ambulatory Visit (INDEPENDENT_AMBULATORY_CARE_PROVIDER_SITE_OTHER): Payer: 59 | Admitting: Obstetrics and Gynecology

## 2018-03-01 ENCOUNTER — Other Ambulatory Visit: Payer: Self-pay

## 2018-03-01 VITALS — BP 122/78 | HR 84 | Resp 16

## 2018-03-01 DIAGNOSIS — Z30431 Encounter for routine checking of intrauterine contraceptive device: Secondary | ICD-10-CM

## 2018-03-01 NOTE — Progress Notes (Signed)
GYNECOLOGY  VISIT   HPI: 57 y.o.   Married  Caucasian  female   [phone] with Patient's last menstrual period was 01/31/2018.   here for  IUD check. She had a mirena IUD inserted 4 weeks ago for treatment of menorrhagia. She had a negative endometrial biopsy and ultrasound c/w adenomyosis. Initially she bleed for a couple of weeks, only occasional cramping. Currently feeling fine. Still spotting.  GYNECOLOGIC HISTORY: Patient's last menstrual period was 01/31/2018. Contraception:IUD  Menopausal hormone therapy: none         OB History    Gravida  4   Para  3   Term  0   Preterm  0   AB  0   Living  3     SAB  0   TAB  0   Ectopic  0   Multiple  0   Live Births  3              Patient Active Problem List   Diagnosis Date Noted  . Family history of breast cancer   . Family history of BRCA1 gene positive   . Family history of ovarian cancer     Past Medical History:  Diagnosis Date  . BRCA1 negative 03/18/09   [place] [facility] Breast Cancer - mother, MGM, MA, MGA X 2, Mcousin  . Cystocele   . Family history of BRCA1 gene positive   . Family history of breast cancer   . Family history of ovarian cancer   . Kidney stone 20's  . Osteopenia   . PMS (premenstrual syndrome)   . Rectocele     Past Surgical History:  Procedure Laterality Date  . COLONOSCOPY    . INTRAUTERINE DEVICE (IUD) INSERTION  01/31/2018   Mirena   . VAGINAL DELIVERY     x3    Current Outpatient Medications  Medication Sig Dispense Refill  . aspirin 81 MG tablet Take 81 mg by mouth daily.    Marland Kitchen aspirin-acetaminophen-caffeine (EXCEDRIN MIGRAINE) 250-250-65 MG per tablet Take 1 tablet by mouth every 6 (six) hours as needed for pain.    Marland Kitchen atorvastatin (LIPITOR) 10 MG tablet     . ibuprofen (ADVIL,MOTRIN) 200 MG tablet Take 200 mg by mouth every 6 (six) hours as needed for pain.    . Multiple Vitamin (MULTIVITAMIN) tablet Take 1 tablet by mouth daily.    . VENTOLIN HFA 108 (90 Base) MCG/ACT  inhaler inhale 2 puffs by mouth every 4 hours if needed for DYSPNEA AND 2 PUFFS PRIOR TO EXERCISE  0  . calcium-vitamin D (OSCAL WITH D) 250-125 MG-UNIT per tablet Take 1 tablet by mouth daily.     No current facility-administered medications for this visit.      ALLERGIES: Patient has no known allergies.  Family History  Problem Relation Age of Onset  . Breast cancer Mother 68       BRCA +  . BRCA 1/2 Mother        BRCA1 Pos  . Breast cancer Maternal Aunt 60       BRCA +  . BRCA 1/2 Maternal Aunt        BRCA1 pos  . Cancer - Colon Maternal Grandfather        colon cancer, BRCA +  . BRCA 1/2 Maternal Grandfather        BRCA1 pos  . Heart disease Father   . Hypertension Father   . Breast cancer Cousin 25  maternal, BRCA -  . BRCA 1/2 Cousin        BRCA Neg  . Ovarian cancer Cousin 25       dec  . Breast cancer Maternal Aunt        BRCA +  . BRCA 1/2 Maternal Aunt        BRCA1 pos  . Breast cancer Maternal Aunt        BRCA +  . BRCA 1/2 Maternal Aunt        BRCA1 pos  . Breast cancer Sister        BRCA -  . BRCA 1/2 Sister        BRCA NEG  . BRCA 1/2 Brother        BRCA1 pos  . Heart disease Paternal Grandfather   . BRCA 1/2 Sister        BRCA Pos    Social History   Socioeconomic History  . Marital status: Married    Spouse name: Not on file  . Number of children: Not on file  . Years of education: Not on file  . Highest education level: Not on file  Occupational History  . Not on file  Social Needs  . Financial resource strain: Not on file  . Food insecurity:    Worry: Not on file    Inability: Not on file  . Transportation needs:    Medical: Not on file    Non-medical: Not on file  Tobacco Use  . Smoking status: Never Smoker  . Smokeless tobacco: Never Used  Substance and Sexual Activity  . Alcohol use: Yes    Alcohol/week: 4.2 oz    Types: 7 Glasses of wine per week  . Drug use: No  . Sexual activity: Yes    Birth control/protection:  Surgical    Comment: vasectomy  Lifestyle  . Physical activity:    Days per week: Not on file    Minutes per session: Not on file  . Stress: Not on file  Relationships  . Social connections:    Talks on phone: Not on file    Gets together: Not on file    Attends religious service: Not on file    Active member of club or organization: Not on file    Attends meetings of clubs or organizations: Not on file    Relationship status: Not on file  . Intimate partner violence:    Fear of current or ex partner: Not on file    Emotionally abused: Not on file    Physically abused: Not on file    Forced sexual activity: Not on file  Other Topics Concern  . Not on file  Social History Narrative  . Not on file    Review of Systems  Constitutional: Negative.   HENT: Negative.   Eyes: Negative.   Respiratory: Negative.   Cardiovascular: Negative.   Gastrointestinal: Negative.   Genitourinary: Negative.   Musculoskeletal: Negative.   Skin: Negative.   Neurological: Negative.   Endo/Heme/Allergies: Negative.   Psychiatric/Behavioral: Negative.     PHYSICAL EXAMINATION:    BP 122/78 (BP Location: Right Arm, Patient Position: Sitting, Cuff Size: Normal)   Pulse 84   Resp 16   LMP 01/31/2018     General appearance: alert, cooperative and appears stated age  Pelvic: External genitalia:  no lesions              Urethra:  normal appearing urethra with no masses, tenderness or lesions  Bartholins and Skenes: normal                 Vagina: normal appearing vagina with normal color and discharge, no lesions              Cervix: no lesions and IUD strings 3-4 cm              Bimanual Exam:  Uterus:  normal size, contour, position, consistency, mobility, non-tender and anteverted              Adnexa: no mass, fullness, tenderness                Chaperone was present for exam.  ASSESSMENT IUD check H/O menorrhagia and likely adenomyosis    PLAN Doing well F/U in 6  months for annual exam   An After Visit Summary was printed and given to the patient.

## 2018-03-02 ENCOUNTER — Ambulatory Visit: Payer: 59 | Admitting: Obstetrics and Gynecology

## 2018-04-21 DIAGNOSIS — I10 Essential (primary) hypertension: Secondary | ICD-10-CM | POA: Diagnosis not present

## 2018-04-21 DIAGNOSIS — L719 Rosacea, unspecified: Secondary | ICD-10-CM | POA: Diagnosis not present

## 2018-06-03 DIAGNOSIS — I10 Essential (primary) hypertension: Secondary | ICD-10-CM | POA: Diagnosis not present

## 2018-06-27 DIAGNOSIS — N289 Disorder of kidney and ureter, unspecified: Secondary | ICD-10-CM | POA: Diagnosis not present

## 2018-06-28 ENCOUNTER — Telehealth: Payer: Self-pay | Admitting: Obstetrics and Gynecology

## 2018-06-28 NOTE — Telephone Encounter (Signed)
Patient had Mirena insertion 01/31/18. Started bleeding. Wanted to be sure this is normal.

## 2018-06-28 NOTE — Telephone Encounter (Signed)
Message left to return call to Chantelle Verdi at [phone].    

## 2018-06-29 NOTE — Telephone Encounter (Signed)
Daily spotting is not uncommon for 3 months, less commonly up to 6 months. She may continue to have light monthly cycles. Only 70% of women stop having menses with the IUD. It doesn't prevent ovulation or PMS symptoms.

## 2018-06-29 NOTE — Telephone Encounter (Signed)
Return call again to patient.   She states she has had daily spotting since IUD placement on 01/31/18.  Stopped spotting for the month of August mostly, now c/o feeling breast tenderness and started spotting again. Having to wear a panty liner only each day. Reports improvement of her cycles, but did not expect daily spotting to continue. Discussed with patient that Mirena IUD appears to be helping with menorrhagia symptoms and her body will continue to cycle with IUD in place, normal symptoms.   She swims each day and sometimes uses tampon. Able to feel IUD strings.   Has annual exam on 09/20/2018 with Dr. Oscar LaJertson.  Would like to know how long she should expect spotting to continue and if continued spotting is expected for her.

## 2018-06-30 NOTE — Telephone Encounter (Signed)
Detailed message left, okay per designated party release form. Advised of message from Dr. Oscar LaJertson.  Call back if any questions

## 2018-09-19 NOTE — Progress Notes (Signed)
57 y.o. [id number] Married White or Caucasian Not Hispanic or Latino female here for annual exam.  The patient had a mirena IUD placed in 4/19 for treatment of menorrhagia. She had a negative endometrial biopsy and an ultrasound c/w adenomyosis.  She is having light bleeding with the IUD a few days a month. Feels like her breasts are tender all the time, every day for the last 2.5-3 months. The nipples are tender, like she is pre-menstrual.  Sexually active, no pain.   She just got back from [place].   She c/o inability to loose weight, feels the IUD is affecting her. She doesn't eat breakfast, normal portions for lunch and dinner, not over eating, drinking less wine. Swims 4 days a week for 1.5 hours, walks every day.  She is getting lab work with her primary. Has had a normal TSH.     No LMP recorded.          Sexually active: Yes.    The current method of family planning is IUD.    Exercising: Yes.    swimming Smoker:  no  Health Maintenance: Pap: 04/30/15, negative with neg HR HPV History of abnormal pap: No MMG: 09/28/2017 Birads 2 benign Colonoscopy:  05/10/12, normal, repeat in 5 years BMD: 04/10/16, -0.2 Spine / -0.5 Left Femur Neck TDaP:  03/2014   reports that she has never smoked. She has never used smokeless tobacco. She reports that she drinks about 7.0 standard drinks of alcohol per week. She reports that she does not use drugs.  Past Medical History:  Diagnosis Date  . BRCA1 negative 03/18/09   [place] [facility] Breast Cancer - mother, MGM, MA, MGA X 2, Mcousin  . Cystocele   . Family history of BRCA1 gene positive   . Family history of breast cancer   . Family history of ovarian cancer   . Kidney stone 20's  . Osteopenia   . PMS (premenstrual syndrome)   . Rectocele     Past Surgical History:  Procedure Laterality Date  . COLONOSCOPY    . INTRAUTERINE DEVICE (IUD) INSERTION  01/31/2018   Mirena   . VAGINAL DELIVERY     x3    Current Outpatient Medications  Medication  Sig Dispense Refill  . aspirin-acetaminophen-caffeine (EXCEDRIN MIGRAINE) 250-250-65 MG per tablet Take 1 tablet by mouth every 6 (six) hours as needed for pain.    Marland Kitchen atorvastatin (LIPITOR) 10 MG tablet     . ibuprofen (ADVIL,MOTRIN) 200 MG tablet Take 200 mg by mouth every 6 (six) hours as needed for pain.    Marland Kitchen levonorgestrel (MIRENA) 20 MCG/24HR IUD 1 each by Intrauterine route once.    . valsartan (DIOVAN) 160 MG tablet Take 160 mg by mouth daily.  5  . VENTOLIN HFA 108 (90 Base) MCG/ACT inhaler inhale 2 puffs by mouth every 4 hours if needed for DYSPNEA AND 2 PUFFS PRIOR TO EXERCISE  0  . aspirin 81 MG tablet Take 81 mg by mouth daily.    . calcium-vitamin D (OSCAL WITH D) 250-125 MG-UNIT per tablet Take 1 tablet by mouth daily.    . Multiple Vitamin (MULTIVITAMIN) tablet Take 1 tablet by mouth daily.     No current facility-administered medications for this visit.     Family History  Problem Relation Age of Onset  . Breast cancer Mother 31       BRCA +  . BRCA 1/2 Mother        BRCA1 Pos  . Breast  cancer Maternal Aunt 60       BRCA +  . BRCA 1/2 Maternal Aunt        BRCA1 pos  . Cancer - Colon Maternal Grandfather        colon cancer, BRCA +  . BRCA 1/2 Maternal Grandfather        BRCA1 pos  . Heart disease Father   . Hypertension Father   . Breast cancer Cousin 25       maternal, BRCA -  . BRCA 1/2 Cousin        BRCA Neg  . Ovarian cancer Cousin 25       dec  . Breast cancer Maternal Aunt        BRCA +  . BRCA 1/2 Maternal Aunt        BRCA1 pos  . Breast cancer Maternal Aunt        BRCA +  . BRCA 1/2 Maternal Aunt        BRCA1 pos  . Breast cancer Sister        BRCA -  . BRCA 1/2 Sister        BRCA NEG  . BRCA 1/2 Brother        BRCA1 pos  . Heart disease Paternal Grandfather   . BRCA 1/2 Sister        BRCA Pos    Review of Systems  Constitutional: Negative.   HENT: Negative.   Eyes: Negative.   Respiratory: Negative.   Cardiovascular: Negative.    Gastrointestinal: Negative.   Endocrine: Negative.   Genitourinary: Negative.   Musculoskeletal:       Breast tenderness  Skin: Negative.   Allergic/Immunologic: Negative.   Neurological: Negative.   Hematological: Negative.   Psychiatric/Behavioral: Negative.     Exam:     Weight change: _0 @ Height:   Height: 5' 9" (175.3 cm)  Ht Readings from Last 3 Encounters:  09/20/18 5' 9" (1.753 m)  05/11/16 5' 9" (1.753 m)  12/13/15 5' 9" (1.753 m)    General appearance: alert, cooperative and appears stated age Head: Normocephalic, without obvious abnormality, atraumatic Neck: no adenopathy, supple, symmetrical, trachea midline and thyroid normal to inspection and palpation Lungs: clear to auscultation bilaterally Cardiovascular: regular rate and rhythm Breasts: no masses, no skin changes, bilaterally tender (most at the nipples) Abdomen: soft, non-tender; non distended,  no masses,  no organomegaly Extremities: extremities normal, atraumatic, no cyanosis or edema Skin: Skin color, texture, turgor normal. No rashes or lesions Lymph nodes: Cervical, supraclavicular, and axillary nodes normal. No abnormal inguinal nodes palpated Neurologic: Grossly normal   Pelvic: External genitalia:  no lesions              Urethra:  normal appearing urethra with no masses, tenderness or lesions              Bartholins and Skenes: normal                 Vagina: normal appearing vagina with normal color and discharge, no lesions              Cervix: no lesions and IUD string 3-4 cm               Bimanual Exam:  Uterus:  normal size, contour, position, consistency, mobility, non-tender              Adnexa: no mass, fullness, tenderness  Rectovaginal: Confirms               Anus:  normal sphincter tone, no lesions  Chaperone was present for exam.  A:  Well Woman with normal exam  IUD check, doing well with the mirena, still premenopausal  P:   Mammogram  Discussed  breast self exam  Discussed calcium and vit D intake  Pap with hpv  Will check when she is due for her colonscopy

## 2018-09-20 ENCOUNTER — Other Ambulatory Visit: Payer: Self-pay

## 2018-09-20 ENCOUNTER — Ambulatory Visit (INDEPENDENT_AMBULATORY_CARE_PROVIDER_SITE_OTHER): Payer: 59 | Admitting: Obstetrics and Gynecology

## 2018-09-20 ENCOUNTER — Other Ambulatory Visit (HOSPITAL_COMMUNITY)
Admission: RE | Admit: 2018-09-20 | Discharge: 2018-09-20 | Disposition: A | Discharge status: Home / Self Care | Payer: 59 | Source: Ambulatory Visit | Attending: Obstetrics and Gynecology | Admitting: Obstetrics and Gynecology

## 2018-09-20 ENCOUNTER — Encounter: Payer: Self-pay | Admitting: Obstetrics and Gynecology

## 2018-09-20 VITALS — BP 122/80 | HR 72 | Ht 69.0 in | Wt 165.2 lb

## 2018-09-20 DIAGNOSIS — Z01419 Encounter for gynecological examination (general) (routine) without abnormal findings: Secondary | ICD-10-CM | POA: Diagnosis not present

## 2018-09-20 DIAGNOSIS — Z124 Encounter for screening for malignant neoplasm of cervix: Secondary | ICD-10-CM | POA: Diagnosis not present

## 2018-09-20 DIAGNOSIS — Z30431 Encounter for routine checking of intrauterine contraceptive device: Secondary | ICD-10-CM

## 2018-09-20 NOTE — Patient Instructions (Signed)
EXERCISE AND DIET:  We recommended that you start or continue a regular exercise program for good health. Regular exercise means any activity that makes your heart beat faster and makes you sweat.  We recommend exercising at least 30 minutes per day at least 3 days a week, preferably 4 or 5.  We also recommend a diet low in fat and sugar.  Inactivity, poor dietary choices and obesity can cause diabetes, heart attack, stroke, and kidney damage, among others.    ALCOHOL AND SMOKING:  Women should limit their alcohol intake to no more than 7 drinks/beers/glasses of wine (combined, not each!) per week. Moderation of alcohol intake to this level decreases your risk of breast cancer and liver damage. And of course, no recreational drugs are part of a healthy lifestyle.  And absolutely no smoking or even second hand smoke. Most people know smoking can cause heart and lung diseases, but did you know it also contributes to weakening of your bones? Aging of your skin?  Yellowing of your teeth and nails?  CALCIUM AND VITAMIN D:  Adequate intake of calcium and Vitamin D are recommended.  The recommendations for exact amounts of these supplements seem to change often, but generally speaking 1,200 mg of calcium (between diet and supplement) and 800 units of Vitamin D per day seems prudent. Certain women may benefit from higher intake of Vitamin D.  If you are among these women, your doctor will have told you during your visit.    PAP SMEARS:  Pap smears, to check for cervical cancer or precancers,  have traditionally been done yearly, although recent scientific advances have shown that most women can have pap smears less often.  However, every woman still should have a physical exam from her gynecologist every year. It will include a breast check, inspection of the vulva and vagina to check for abnormal growths or skin changes, a visual exam of the cervix, and then an exam to evaluate the size and shape of the uterus and  ovaries.  And after 56 years of age, a rectal exam is indicated to check for rectal cancers. We will also provide age appropriate advice regarding health maintenance, like when you should have certain vaccines, screening for sexually transmitted diseases, bone density testing, colonoscopy, mammograms, etc.   MAMMOGRAMS:  All women over 40 years old should have a yearly mammogram. Many facilities now offer a "3D" mammogram, which may cost around $50 extra out of pocket. If possible,  we recommend you accept the option to have the 3D mammogram performed.  It both reduces the number of women who will be called back for extra views which then turn out to be normal, and it is better than the routine mammogram at detecting truly abnormal areas.    COLONOSCOPY:  Colonoscopy to screen for colon cancer is recommended for all women at age 50.  We know, you hate the idea of the prep.  We agree, BUT, having colon cancer and not knowing it is worse!!  Colon cancer so often starts as a polyp that can be seen and removed at colonscopy, which can quite literally save your life!  And if your first colonoscopy is normal and you have no family history of colon cancer, most women don't have to have it again for 10 years.  Once every ten years, you can do something that may end up saving your life, right?  We will be happy to help you get it scheduled when you are ready.    Be sure to check your insurance coverage so you understand how much it will cost.  It may be covered as a preventative service at no cost, but you should check your particular policy.      Breast Self-Awareness Breast self-awareness means being familiar with how your breasts look and feel. It involves checking your breasts regularly and reporting any changes to your health care provider. Practicing breast self-awareness is important. A change in your breasts can be a sign of a serious medical problem. Being familiar with how your breasts look and feel allows  you to find any problems early, when treatment is more likely to be successful. All women should practice breast self-awareness, including women who have had breast implants. How to do a breast self-exam One way to learn what is normal for your breasts and whether your breasts are changing is to do a breast self-exam. To do a breast self-exam: Look for Changes  1. Remove all the clothing above your waist. 2. Stand in front of a mirror in a room with good lighting. 3. Put your hands on your hips. 4. Push your hands firmly downward. 5. Compare your breasts in the mirror. Look for differences between them (asymmetry), such as: ? Differences in shape. ? Differences in size. ? Puckers, dips, and bumps in one breast and not the other. 6. Look at each breast for changes in your skin, such as: ? Redness. ? Scaly areas. 7. Look for changes in your nipples, such as: ? Discharge. ? Bleeding. ? Dimpling. ? Redness. ? A change in position. Feel for Changes  Carefully feel your breasts for lumps and changes. It is best to do this while lying on your back on the floor and again while sitting or standing in the shower or tub with soapy water on your skin. Feel each breast in the following way:  Place the arm on the side of the breast you are examining above your head.  Feel your breast with the other hand.  Start in the nipple area and make  inch (2 cm) overlapping circles to feel your breast. Use the pads of your three middle fingers to do this. Apply light pressure, then medium pressure, then firm pressure. The light pressure will allow you to feel the tissue closest to the skin. The medium pressure will allow you to feel the tissue that is a little deeper. The firm pressure will allow you to feel the tissue close to the ribs.  Continue the overlapping circles, moving downward over the breast until you feel your ribs below your breast.  Move one finger-width toward the center of the body.  Continue to use the  inch (2 cm) overlapping circles to feel your breast as you move slowly up toward your collarbone.  Continue the up and down exam using all three pressures until you reach your armpit.  Write Down What You Find  Write down what is normal for each breast and any changes that you find. Keep a written record with breast changes or normal findings for each breast. By writing this information down, you do not need to depend only on memory for size, tenderness, or location. Write down where you are in your menstrual cycle, if you are still menstruating. If you are having trouble noticing differences in your breasts, do not get discouraged. With time you will become more familiar with the variations in your breasts and more comfortable with the exam. How often should I examine my breasts? Examine   your breasts every month. If you are breastfeeding, the best time to examine your breasts is after a feeding or after using a breast pump. If you menstruate, the best time to examine your breasts is 5-7 days after your period is over. During your period, your breasts are lumpier, and it may be more difficult to notice changes. When should I see my health care provider? See your health care provider if you notice:  A change in shape or size of your breasts or nipples.  A change in the skin of your breast or nipples, such as a reddened or scaly area.  Unusual discharge from your nipples.  A lump or thick area that was not there before.  Pain in your breasts.  Anything that concerns you.  This information is not intended to replace advice given to you by your health care provider. Make sure you discuss any questions you have with your health care provider. Document Released: 10/19/2005 Document Revised: 03/26/2016 Document Reviewed: 09/08/2015 Elsevier Interactive Patient Education  2018 [employer] Inc[employer]  

## 2018-09-21 ENCOUNTER — Encounter: Payer: Self-pay | Admitting: Obstetrics and Gynecology

## 2018-09-21 LAB — CYTOLOGY - PAP
Diagnosis: NEGATIVE
HPV: NOT DETECTED

## 2018-11-10 ENCOUNTER — Encounter: Payer: Self-pay | Admitting: Cardiovascular Disease

## 2018-11-10 DIAGNOSIS — R7303 Prediabetes: Secondary | ICD-10-CM | POA: Diagnosis not present

## 2018-11-10 DIAGNOSIS — E559 Vitamin D deficiency, unspecified: Secondary | ICD-10-CM | POA: Diagnosis not present

## 2018-11-10 DIAGNOSIS — I10 Essential (primary) hypertension: Secondary | ICD-10-CM | POA: Diagnosis not present

## 2018-11-10 DIAGNOSIS — E785 Hyperlipidemia, unspecified: Secondary | ICD-10-CM | POA: Diagnosis not present

## 2018-11-10 DIAGNOSIS — Z0000 Encounter for general adult medical examination without abnormal findings: Secondary | ICD-10-CM | POA: Diagnosis not present

## 2018-12-23 ENCOUNTER — Other Ambulatory Visit: Payer: Self-pay | Admitting: Obstetrics and Gynecology

## 2018-12-23 DIAGNOSIS — Z1231 Encounter for screening mammogram for malignant neoplasm of breast: Secondary | ICD-10-CM

## 2019-01-17 ENCOUNTER — Ambulatory Visit: Payer: 59

## 2019-02-08 ENCOUNTER — Ambulatory Visit: Payer: 59

## 2019-04-20 ENCOUNTER — Ambulatory Visit
Admission: RE | Admit: 2019-04-20 | Discharge: 2019-04-20 | Disposition: A | Discharge status: Home / Self Care | Payer: 59 | Source: Ambulatory Visit | Attending: Obstetrics and Gynecology | Admitting: Obstetrics and Gynecology

## 2019-04-20 ENCOUNTER — Other Ambulatory Visit: Payer: Self-pay

## 2019-04-20 DIAGNOSIS — Z1231 Encounter for screening mammogram for malignant neoplasm of breast: Secondary | ICD-10-CM

## 2019-10-04 ENCOUNTER — Other Ambulatory Visit: Payer: Self-pay

## 2019-10-04 NOTE — Progress Notes (Signed)
58 y.o. [id number] Married White or Caucasian Not Hispanic or Latino female here for annual exam.   Period Duration (Days): intermittent spotting monthly Period Pattern: (!) Irregular Menstrual Flow: Light  The patient had a mirena IUD placed in 4/19 for treatment of menorrhagia. Prior to insertion she had a negative endometrial biopsy and an ultrasound c/w adenomyosis. Just occasional spotting. Just starting with some minor vasomotor symptoms.   She has a h/o a rectocele, only occasionally bothers her, if she gets a little constipated.   She had a mild case of Covid.   Patient's last menstrual period was 01/31/2018.          Sexually active: Yes.    The current method of family planning is IUD.    Exercising: Yes.    swimming, personal trainer Smoker:  no  Health Maintenance: Pap:09/20/2018 WNL NEG HPV,  04/30/15 negative with neg HR HPV  History of abnormal pap: No MMG: 04/20/2019 Birads 1 negative Colonoscopy: 05/10/12, normal, repeat in 10 years BMD: 04/10/16, -0.2 Spine / -0.5 Left Femur Neck TDaP: 03/2014   reports that she has never smoked. She has never used smokeless tobacco. She reports current alcohol use of about 5.0 standard drinks of alcohol per week. She reports that she does not use drugs. She is a Forensic psychologist, doing well.   Past Medical History:  Diagnosis Date  . BRCA1 negative 03/18/09   [place] [facility] - mother, MGM, MA, MGA X 2, [place]  . Cystocele   . Family history of BRCA1 gene positive   . Family history of breast cancer   . Family history of ovarian cancer   . History of 2019 novel coronavirus disease (COVID-19)   . Kidney stone 20's  . Osteopenia   . PMS (premenstrual syndrome)   . Rectocele     Past Surgical History:  Procedure Laterality Date  . COLONOSCOPY    . INTRAUTERINE DEVICE (IUD) INSERTION  01/31/2018   Mirena   . VAGINAL DELIVERY     x3    Current Outpatient Medications  Medication Sig Dispense Refill  . atorvastatin  (LIPITOR) 10 MG tablet     . calcium-vitamin D (OSCAL WITH D) 250-125 MG-UNIT per tablet Take 1 tablet by mouth daily.    Marland Kitchen levonorgestrel (MIRENA) 20 MCG/24HR IUD 1 each by Intrauterine route once.    . valsartan (DIOVAN) 160 MG tablet Take 160 mg by mouth daily.  5  . VENTOLIN HFA 108 (90 Base) MCG/ACT inhaler inhale 2 puffs by mouth every 4 hours if needed for DYSPNEA AND 2 PUFFS PRIOR TO EXERCISE  0   No current facility-administered medications for this visit.     Family History  Problem Relation Age of Onset  . Breast cancer Mother 83       BRCA +  . BRCA 1/2 Mother        BRCA1 Pos  . Breast cancer Maternal Aunt 60       BRCA +  . BRCA 1/2 Maternal Aunt        BRCA1 pos  . Cancer - Colon Maternal Grandfather        colon cancer, BRCA +  . BRCA 1/2 Maternal Grandfather        BRCA1 pos  . Heart disease Father   . Hypertension Father   . Breast cancer Cousin 25       maternal, BRCA -  . BRCA 1/2 Cousin        BRCA  Neg  . Ovarian cancer Cousin 25       dec  . Breast cancer Maternal Aunt        BRCA +  . BRCA 1/2 Maternal Aunt        BRCA1 pos  . Breast cancer Maternal Aunt        BRCA +  . BRCA 1/2 Maternal Aunt        BRCA1 pos  . Breast cancer Sister        BRCA -  . BRCA 1/2 Sister        BRCA NEG  . BRCA 1/2 Brother        BRCA1 pos  . Heart disease Paternal Grandfather   . BRCA 1/2 Sister        BRCA Pos    Review of Systems  Constitutional: Negative.   HENT: Negative.   Eyes: Negative.   Respiratory: Negative.   Cardiovascular: Negative.   Gastrointestinal: Negative.   Endocrine: Negative.   Genitourinary: Negative.   Musculoskeletal: Negative.   Skin: Negative.   Allergic/Immunologic: Negative.   Neurological: Negative.   Hematological: Negative.   Psychiatric/Behavioral: Negative.     Exam:   BP 108/76 (BP Location: Left Arm, Patient Position: Sitting, Cuff Size: Normal)   Pulse 82   Temp (!) 97.2 F (36.2 C) (Skin)   Ht '5\' 9"'  (1.753  m)   LMP 01/31/2018   BMI 24.40 kg/m   Weight change: '@WEIGHTCHANGE' @ Height:   Height: '5\' 9"'  (175.3 cm)  Ht Readings from Last 3 Encounters:  10/05/19 '5\' 9"'  (1.753 m)  09/20/18 '5\' 9"'  (1.753 m)  05/11/16 '5\' 9"'  (1.753 m)    General appearance: alert, cooperative and appears stated age Head: Normocephalic, without obvious abnormality, atraumatic Neck: no adenopathy, supple, symmetrical, trachea midline and thyroid normal to inspection and palpation Lungs: clear to auscultation bilaterally Cardiovascular: regular rate and rhythm Breasts: normal appearance, no masses or tenderness Abdomen: soft, non-tender; non distended,  no masses,  no organomegaly Extremities: extremities normal, atraumatic, no cyanosis or edema Skin: Skin color, texture, turgor normal. No rashes or lesions Lymph nodes: Cervical, supraclavicular, and axillary nodes normal. No abnormal inguinal nodes palpated Neurologic: Grossly normal   Pelvic: External genitalia:  no lesions              Urethra:  normal appearing urethra with no masses, tenderness or lesions              Bartholins and Skenes: normal                 Vagina: normal appearing vagina with normal color and discharge, no lesions              Cervix: no lesions and IUD string 2-3 cm               Bimanual Exam:  Uterus:  normal size, contour, position, consistency, mobility, non-tender              Adnexa: no mass, fullness, tenderness               Rectovaginal: Confirms               Anus:  normal sphincter tone, no lesions  Chaperone was present for exam.  A:  Well Woman with normal exam  Still perimenopausal  IUD check, doing well  P:   No pap this year  Mammogram in 6/21  Colonoscopy UTD  Discussed breast self exam  Discussed  calcium and vit D intake  Labs with primary

## 2019-10-05 ENCOUNTER — Ambulatory Visit (INDEPENDENT_AMBULATORY_CARE_PROVIDER_SITE_OTHER): Payer: 59 | Admitting: Obstetrics and Gynecology

## 2019-10-05 ENCOUNTER — Encounter: Payer: Self-pay | Admitting: Obstetrics and Gynecology

## 2019-10-05 VITALS — BP 108/76 | HR 82 | Temp 97.2°F | Ht 69.0 in

## 2019-10-05 DIAGNOSIS — Z01419 Encounter for gynecological examination (general) (routine) without abnormal findings: Secondary | ICD-10-CM | POA: Diagnosis not present

## 2019-10-05 DIAGNOSIS — Z30431 Encounter for routine checking of intrauterine contraceptive device: Secondary | ICD-10-CM

## 2019-10-05 NOTE — Patient Instructions (Signed)

## 2019-12-18 ENCOUNTER — Ambulatory Visit: Payer: 59 | Attending: Internal Medicine

## 2019-12-18 DIAGNOSIS — Z23 Encounter for immunization: Secondary | ICD-10-CM | POA: Insufficient documentation

## 2019-12-18 DIAGNOSIS — Z20822 Contact with and (suspected) exposure to covid-19: Secondary | ICD-10-CM

## 2019-12-18 NOTE — Progress Notes (Signed)
   Covid-19 Vaccination Clinic  Name:  Mary Porter    MRN: [id number] DOB: 1961-08-02  12/18/2019  [name] was observed post Covid-19 immunization for 15 minutes without incidence. She was provided with Vaccine Information Sheet and instruction to access the V-Safe system.   [name] was instructed to call 911 with any severe reactions post vaccine: Marland Kitchen Difficulty breathing  . Swelling of your face and throat  . A fast heartbeat  . A bad rash all over your body  . Dizziness and weakness    Immunizations Administered    Name Date Dose VIS Date Route   Pfizer COVID-19 Vaccine 12/18/2019  7:01 PM 0.3 mL 10/13/2019 Intramuscular   Manufacturer: [employer], [employer]   Lot: [id number]   NDC: [address]-4

## 2019-12-19 LAB — NOVEL CORONAVIRUS, NAA: SARS-CoV-2, NAA: NOT DETECTED

## 2020-01-09 ENCOUNTER — Ambulatory Visit: Payer: 59 | Attending: Internal Medicine

## 2020-01-09 DIAGNOSIS — Z23 Encounter for immunization: Secondary | ICD-10-CM | POA: Insufficient documentation

## 2020-01-09 NOTE — Progress Notes (Signed)
   Covid-19 Vaccination Clinic  Name:  IKIA CINCOTTA    MRN: [id number] DOB: 07-10-1961  01/09/2020  Ms. Gadd was observed post Covid-19 immunization for 15 minutes without incident. She was provided with Vaccine Information Sheet and instruction to access the V-Safe system.   Ms. Elden was instructed to call 911 with any severe reactions post vaccine: Marland Kitchen Difficulty breathing  . Swelling of face and throat  . A fast heartbeat  . A bad rash all over body  . Dizziness and weakness   Immunizations Administered    Name Date Dose VIS Date Route   Pfizer COVID-19 Vaccine 01/09/2020 12:18 PM 0.3 mL 10/13/2019 Intramuscular   Manufacturer: [employer], [employer]   Lot: [id number]   NDC: [address]-2

## 2020-01-10 ENCOUNTER — Ambulatory Visit: Payer: 59

## 2020-02-15 ENCOUNTER — Encounter: Payer: Self-pay | Admitting: Cardiovascular Disease

## 2020-02-15 ENCOUNTER — Ambulatory Visit (INDEPENDENT_AMBULATORY_CARE_PROVIDER_SITE_OTHER): Payer: 59 | Admitting: Cardiovascular Disease

## 2020-02-15 ENCOUNTER — Other Ambulatory Visit: Payer: Self-pay

## 2020-02-15 VITALS — BP 130/84 | HR 78 | Ht 69.0 in | Wt 168.0 lb

## 2020-02-15 DIAGNOSIS — E7800 Pure hypercholesterolemia, unspecified: Secondary | ICD-10-CM

## 2020-02-15 DIAGNOSIS — Z8249 Family history of ischemic heart disease and other diseases of the circulatory system: Secondary | ICD-10-CM | POA: Diagnosis not present

## 2020-02-15 DIAGNOSIS — R002 Palpitations: Secondary | ICD-10-CM

## 2020-02-15 NOTE — Patient Instructions (Signed)
Medication Instructions:  Your physician recommends that you continue on your current medications as directed. Please refer to the Current Medication list given to you today.  *If you need a refill on your cardiac medications before your next appointment, please call your pharmacy*   Lab Work: None Ordered If you have labs (blood work) drawn today and your tests are completely normal, you will receive your results only by: Marland Kitchen MyChart Message (if you have MyChart) OR . A paper copy in the mail If you have any lab test that is abnormal or we need to change your treatment, we will call you to review the results.   Testing/Procedures: Non-Cardiac CT scanning for Calcium Score, (CAT scanning), is a noninvasive, special x-ray that produces cross-sectional images of the body using x-rays and a computer. CT scans help physicians diagnose and treat medical conditions. CT scans provide greater clarity and reveal more details than regular x-ray exams.  Your physician has requested that you have an echocardiogram. Echocardiography is a painless test that uses sound waves to create images of your heart. It provides your doctor with information about the size and shape of your heart and how well your heart's chambers and valves are working. This procedure takes approximately one hour. There are no restrictions for this procedure.     Follow-Up: At [facility], you and your health needs are our priority.  As part of our continuing mission to provide you with exceptional heart care, we have created designated Provider Care Teams.  These Care Teams include your primary Cardiologist (physician) and Advanced Practice Providers (APPs -  Physician Assistants and Nurse Practitioners) who all work together to provide you with the care you need, when you need it.   Your next appointment:   2 month(s)  The format for your next appointment:   In Person  Provider:   You may see Kristeen Miss, MD or one of  the following Advanced Practice Providers on your designated Care Team:    Tereso Newcomer, PA-C  Vin [place], [place]  [place]erton [place], [place]

## 2020-02-15 NOTE — Progress Notes (Signed)
Cardiology Office Note:    Date:  02/15/2020   ID:  TRENT THEISEN, DOB [birth year], MRN [id number]  PCP:  Harlan Stains, MD  Cardiologist:  Cicilia Clinger  Electrophysiologist:  None   Referring MD: Harlan Stains, MD   No chief complaint on file.   History of Present Illness:    HAYLEEN CLINKSCALES is a 59 y.o. female with a hx of hyperlipidemia.  She recently has developed some palpitations with exercise.  We are asked to see her today by Dr. Dema Severin for further evaluation of these palpitations.  Labs from Dr. Dema Severin were reviewed.  She had a NMR lipid panel performed this week.  Her LDL particle number is 1470 .  The total cholesterol is 210.  The LDL 130.  Her HDL is 70.  Triglyceride levels 49. Vitamin D level is 23.3.  Friend of Bea Laura.  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   Has noticed that her heart is beating hard after several fast sets  Swam Rupert Stacks several years ago.    Has had episodes of tachycardia while standing in the kitchen .   Resolves with deep breaths .  Has noticed some tingling in her legs after swimming hard.  She has noticed some shortness of breath compared to how she felt last year.   Has been working out with a Physiological scientist .   gets winded when doing suicides with the trainer .       Past Medical History:  Diagnosis Date  . Acne rosacea   . BRCA1 negative 03/18/09   [facility] Cancer - mother, MGM, MA, MGA X 2, Mcousin  . Cystocele   . Exercise-induced asthma   . Family history of BRCA1 gene positive   . Family history of breast cancer   . Family history of breast cancer in female   . Family history of ovarian cancer   . History of 2019 novel coronavirus disease (COVID-19)   . History of COVID-19   . Hypercholesterolemia   . Hyperlipidemia   . Hypertension   . Kidney stone 20's  . Nephrolithiasis   . Osteopenia   . Palpitations   . PMDD (premenstrual dysphoric disorder)   . PMS (premenstrual syndrome)   . Prediabetes     . Rectocele   . Torn meniscus    right knee  . Vitamin D deficiency     Past Surgical History:  Procedure Laterality Date  . COLONOSCOPY    . INTRAUTERINE DEVICE (IUD) INSERTION  01/31/2018   Mirena   . VAGINAL DELIVERY     x3    Current Medications: Current Meds  Medication Sig  . atorvastatin (LIPITOR) 10 MG tablet   . doxycycline (DORYX) 100 MG EC tablet Take 100 mg by mouth daily.  Marland Kitchen doxycycline (VIBRAMYCIN) 100 MG capsule Take 100 mg by mouth daily.  Marland Kitchen levonorgestrel (MIRENA) 20 MCG/24HR IUD 1 each by Intrauterine route once.  . Multiple Vitamin (MULTIVITAMIN) tablet Take 1 tablet by mouth daily.  . valsartan (DIOVAN) 160 MG tablet Take 160 mg by mouth daily.  . VENTOLIN HFA 108 (90 Base) MCG/ACT inhaler inhale 2 puffs by mouth every 4 hours if needed for DYSPNEA AND 2 PUFFS PRIOR TO EXERCISE  . [DISCONTINUED] calcium-vitamin D (OSCAL WITH D) 250-125 MG-UNIT per tablet Take 1 tablet by mouth daily.     Allergies:   Patient has no known allergies.   Social History   Socioeconomic History  . Marital  status: Married    Spouse name: Not on file  . Number of children: Not on file  . Years of education: Not on file  . Highest education level: Not on file  Occupational History  . Not on file  Tobacco Use  . Smoking status: Never Smoker  . Smokeless tobacco: Never Used  Substance and Sexual Activity  . Alcohol use: Yes    Alcohol/week: 5.0 standard drinks    Types: 5 Glasses of wine per week  . Drug use: No  . Sexual activity: Yes    Birth control/protection: Surgical, I.U.D.    Comment: vasectomy  Other Topics Concern  . Not on file  Social History Narrative  . Not on file   Social Determinants of Health   Financial Resource Strain:   . Difficulty of Paying Living Expenses:   Food Insecurity:   . Worried About Charity fundraiser in the Last Year:   . Arboriculturist in the Last Year:   Transportation Needs:   . Film/video editor (Medical):   Marland Kitchen  Lack of Transportation (Non-Medical):   Physical Activity:   . Days of Exercise per Week:   . Minutes of Exercise per Session:   Stress:   . Feeling of Stress :   Social Connections:   . Frequency of Communication with Friends and Family:   . Frequency of Social Gatherings with Friends and Family:   . Attends Religious Services:   . Active Member of Clubs or Organizations:   . Attends Archivist Meetings:   Marland Kitchen Marital Status:      Family History: The patient's family history includes BRCA 1/2 in her brother, cousin, maternal aunt, maternal aunt, maternal aunt, maternal grandfather, mother, sister, and sister; Breast cancer in her maternal aunt, maternal aunt, and sister; Breast cancer (age of onset: 64) in her cousin; Breast cancer (age of onset: 31) in her maternal aunt; Breast cancer (age of onset: 91) in her mother; Cancer - Colon in her maternal grandfather; Heart disease in her father and paternal grandfather; Hypertension in her father; Ovarian cancer (age of onset: 30) in her cousin.  Father had cornonary stents  ROS:   Please see the history of present illness.     All other systems reviewed and are negative.  EKGs/Labs/Other Studies Reviewed:    The following studies were reviewed today:   EKG:      February 15, 2020:   NSR at 6   Recent Labs: No results found for requested labs within last 8760 hours.  Recent Lipid Panel No results found for: CHOL, TRIG, HDL, CHOLHDL, VLDL, LDLCALC, LDLDIRECT  Physical Exam:    VS:  BP 130/84   Pulse 78   Ht '5\' 9"'  (1.753 m)   Wt 168 lb (76.2 kg)   LMP 01/31/2018   SpO2 99%   BMI 24.81 kg/m     Wt Readings from Last 3 Encounters:  02/15/20 168 lb (76.2 kg)  09/20/18 165 lb 3.2 oz (74.9 kg)  01/31/18 168 lb (76.2 kg)     GEN:  Well nourished, well developed in no acute distress HEENT: Normal NECK: No JVD; No carotid bruits LYMPHATICS: No lymphadenopathy CARDIAC: RRR, no murmurs, rubs, gallops RESPIRATORY:  Clear  to auscultation without rales, wheezing or rhonchi  ABDOMEN: Soft, non-tender, non-distended MUSCULOSKELETAL:  No edema; No deformity  SKIN: Warm and dry NEUROLOGIC:  Alert and oriented x 3 PSYCHIATRIC:  Normal affect   ASSESSMENT:  1. Palpitations   2. Family history of early CAD   3. Hypercholesterolemia    PLAN:    In order of problems listed above:  1. Palpitations: Tarrah Furuta with some palpitations that occur while she is swimming.  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  She does not hydrate before swimming.  I encouraged her to stay hydrated and to make sure she is getting enough protein the night before.  She does not eat or drink before going to swim in the mornings.  I encouraged her to get some liquid IV or drink V8 juice on the way to the pool.  We will get an echocardiogram to ensure that her cardiac function and valvular function are good.  2.  Hyperlipidemia: She has a family history of coronary artery disease and has a 5 history of hyperlipidemia herself.  Her last LDL particle number was 1470   She is on atorvastatin 10 mg a day.  I would like to get a coronary calcium score to help [place] evaluate her risk for coronary artery disease.  We discussed increasing her atorvastatin, perhaps changing to rosuvastatin and then possibly sending her to the lipid clinic for PCSK9 inhibitors.  I will see her again in several months.  Medication Adjustments/Labs and Tests Ordered: Current medicines are reviewed at length with the patient today.  Concerns regarding medicines are outlined above.  Orders Placed This Encounter  Procedures  . CT CARDIAC SCORING  . EKG 12-Lead  . ECHOCARDIOGRAM COMPLETE   No orders of the defined types were placed in this encounter.   Patient Instructions  Medication Instructions:  Your physician recommends that you continue on your current medications as directed. Please refer to the Current Medication list given to  you today.  *If you need a refill on your cardiac medications before your next appointment, please call your pharmacy*   Lab Work: None Ordered If you have labs (blood work) drawn today and your tests are completely normal, you will receive your results only by: Marland Kitchen MyChart Message (if you have MyChart) OR . A paper copy in the mail If you have any lab test that is abnormal or we need to change your treatment, we will call you to review the results.   Testing/Procedures: Non-Cardiac CT scanning for Calcium Score, (CAT scanning), is a noninvasive, special x-ray that produces cross-sectional images of the body using x-rays and a computer. CT scans help physicians diagnose and treat medical conditions. CT scans provide greater clarity and reveal more details than regular x-ray exams.  Your physician has requested that you have an echocardiogram. Echocardiography is a painless test that uses sound waves to create images of your heart. It provides your doctor with information about the size and shape of your heart and how well your heart's chambers and valves are working. This procedure takes approximately one hour. There are no restrictions for this procedure.     Follow-Up: At [facility], you and your health needs are our priority.  As part of our continuing mission to provide you with exceptional heart care, we have created designated Provider Care Teams.  These Care Teams include your primary Cardiologist (physician) and Advanced Practice Providers (APPs -  Physician Assistants and Nurse Practitioners) who all work together to provide you with the care you need, when you need it.   Your next appointment:   2 month(s)  The format for your next appointment:   In Person  Provider:  You may see Mertie Moores, MD or one of the following Advanced Practice Providers on your designated Care Team:    Richardson Dopp, PA-C  Vin [place], [place]  Daune Perch, [place]        Signed, Mertie Moores, MD  02/15/2020 9:31 AM    [place]

## 2020-02-29 ENCOUNTER — Ambulatory Visit (HOSPITAL_COMMUNITY): Payer: 59 | Attending: Internal Medicine

## 2020-02-29 ENCOUNTER — Ambulatory Visit (INDEPENDENT_AMBULATORY_CARE_PROVIDER_SITE_OTHER)
Admission: RE | Admit: 2020-02-29 | Discharge: 2020-02-29 | Disposition: A | Discharge status: Home / Self Care | Payer: Self-pay | Source: Ambulatory Visit | Attending: Cardiovascular Disease | Admitting: Cardiovascular Disease

## 2020-02-29 ENCOUNTER — Other Ambulatory Visit: Payer: Self-pay

## 2020-02-29 ENCOUNTER — Telehealth: Payer: Self-pay

## 2020-02-29 DIAGNOSIS — R002 Palpitations: Secondary | ICD-10-CM | POA: Insufficient documentation

## 2020-02-29 DIAGNOSIS — E7800 Pure hypercholesterolemia, unspecified: Secondary | ICD-10-CM

## 2020-02-29 DIAGNOSIS — Z8249 Family history of ischemic heart disease and other diseases of the circulatory system: Secondary | ICD-10-CM

## 2020-02-29 NOTE — Telephone Encounter (Signed)
The patient has been notified of the Echo result and verbalized understanding.  All questions (if any) were answered. Sigurd Sos, RN 02/29/2020 4:19 PM

## 2020-02-29 NOTE — Telephone Encounter (Signed)
-----   Message from Vesta Mixer, MD sent at 02/29/2020  3:48 PM EDT ----- Normal LV systolic  function ,  mild LVH,  grade 1 diastolic dysfunction  The mild LVH and grade 1 diastolic dysfunction are likely due to her years of competitive swimming .  No significant valvular abnormalities

## 2020-03-05 ENCOUNTER — Telehealth: Payer: Self-pay | Admitting: Nurse Practitioner

## 2020-03-05 DIAGNOSIS — Z8249 Family history of ischemic heart disease and other diseases of the circulatory system: Secondary | ICD-10-CM

## 2020-03-05 DIAGNOSIS — R0609 Other forms of dyspnea: Secondary | ICD-10-CM

## 2020-03-05 MED ORDER — ROSUVASTATIN CALCIUM 10 MG PO TABS: 10.0000 mg | ORAL_TABLET | Freq: Every day | ORAL | 3 refills | Status: DC

## 2020-03-05 NOTE — Telephone Encounter (Signed)
-----   Message from Vesta Mixer, MD sent at 03/05/2020 12:11 PM EDT ----- Coronary calcium score of 178. This was 95th percentile for age and sex matched control. Calcium noted in the LAD and RCA.  Lets change her Atorvastatin to rosuvastatin 10 mg a day  Recheck lipids, liver, bmp in 3 months .  Please also get a exercise myoview since she has been having more DOE with exertion recently  She will need to see me in 4-6 weeks for follow up .

## 2020-03-05 NOTE — Telephone Encounter (Signed)
Reviewed results and plan of care with patient. She agrees to stop atorvastatin and start rosuvastatin 10 mg daily. She agrees with plan to have exercise myoview and I advised her about Covid testing that will need to be done prior to the stress test. She is aware someone will call her to schedule. I advised her to call back with questions prior to testing and that once test is complete, our office will call her with results. She is aware of appointment in June with Dr. Elease Hashimoto for follow-up and thanked me for the call.

## 2020-04-23 ENCOUNTER — Encounter: Payer: Self-pay | Admitting: Nurse Practitioner

## 2020-04-23 ENCOUNTER — Ambulatory Visit (INDEPENDENT_AMBULATORY_CARE_PROVIDER_SITE_OTHER): Payer: 59 | Admitting: Cardiovascular Disease

## 2020-04-23 ENCOUNTER — Encounter: Payer: Self-pay | Admitting: Cardiovascular Disease

## 2020-04-23 ENCOUNTER — Telehealth: Payer: Self-pay | Admitting: Nurse Practitioner

## 2020-04-23 ENCOUNTER — Other Ambulatory Visit: Payer: Self-pay

## 2020-04-23 VITALS — BP 104/74 | HR 70 | Ht 69.0 in | Wt 165.0 lb

## 2020-04-23 DIAGNOSIS — E7800 Pure hypercholesterolemia, unspecified: Secondary | ICD-10-CM

## 2020-04-23 DIAGNOSIS — R0600 Dyspnea, unspecified: Secondary | ICD-10-CM

## 2020-04-23 DIAGNOSIS — I2584 Coronary atherosclerosis due to calcified coronary lesion: Secondary | ICD-10-CM

## 2020-04-23 DIAGNOSIS — E782 Mixed hyperlipidemia: Secondary | ICD-10-CM

## 2020-04-23 DIAGNOSIS — E785 Hyperlipidemia, unspecified: Secondary | ICD-10-CM | POA: Insufficient documentation

## 2020-04-23 DIAGNOSIS — I2510 Atherosclerotic heart disease of native coronary artery without angina pectoris: Secondary | ICD-10-CM | POA: Insufficient documentation

## 2020-04-23 DIAGNOSIS — R0609 Other forms of dyspnea: Secondary | ICD-10-CM

## 2020-04-23 DIAGNOSIS — Z8249 Family history of ischemic heart disease and other diseases of the circulatory system: Secondary | ICD-10-CM

## 2020-04-23 LAB — LIPID PANEL
Chol/HDL Ratio: 2.7 ratio (ref 0.0–4.4)
Cholesterol, Total: 192 mg/dL (ref 100–199)
HDL: 72 mg/dL (ref >39)
LDL Chol Calc (NIH): 109 mg/dL — ABNORMAL HIGH (ref 0–99)
Triglycerides: 60 mg/dL (ref 0–149)
VLDL Cholesterol Cal: 11 mg/dL (ref 5–40)

## 2020-04-23 LAB — BASIC METABOLIC PANEL
BUN/Creatinine Ratio: 14 (ref 9–23)
BUN: 16 mg/dL (ref 6–24)
CO2: 22 mmol/L (ref 20–29)
Calcium: 9.7 mg/dL (ref 8.7–10.2)
Chloride: 103 mmol/L (ref 96–106)
Creatinine, Ser: 1.12 mg/dL — ABNORMAL HIGH (ref 0.57–1.00)
GFR calc Af Amer: 63 mL/min/{1.73_m2} (ref >59)
GFR calc non Af Amer: 54 mL/min/{1.73_m2} — ABNORMAL LOW (ref >59)
Glucose: 121 mg/dL — ABNORMAL HIGH (ref 65–99)
Potassium: 5 mmol/L (ref 3.5–5.2)
Sodium: 137 mmol/L (ref 134–144)

## 2020-04-23 LAB — HEPATIC FUNCTION PANEL
ALT: 17 IU/L (ref 0–32)
AST: 21 IU/L (ref 0–40)
Albumin: 4.7 g/dL (ref 3.8–4.9)
Alkaline Phosphatase: 58 IU/L (ref 48–121)
Bilirubin Total: 0.5 mg/dL (ref 0.0–1.2)
Bilirubin, Direct: 0.14 mg/dL (ref 0.00–0.40)
Total Protein: 7.2 g/dL (ref 6.0–8.5)

## 2020-04-23 MED ORDER — EZETIMIBE 10 MG PO TABS: 10.0000 mg | ORAL_TABLET | Freq: Every day | ORAL | 3 refills | Status: DC

## 2020-04-23 NOTE — Telephone Encounter (Signed)
Results and plan of care reviewed with patient who verbalized understanding and agreement. She will start Zetia 10 mg once daily and return for lab work in 3 months. I advised her to call our office prior to her appointments with questions or concerns. She thanked me for the call.

## 2020-04-23 NOTE — Progress Notes (Signed)
Cardiology Office Note:    Date:  04/23/2020   ID:  Mary Porter, DOB September 26, 1961, MRN [id number]  PCP:  Harlan Stains, MD  Cardiologist:  Charlie Seda  Electrophysiologist:  None   Referring MD: Harlan Stains, MD   Chief Complaint  Patient presents with  . Palpitations    History of Present Illness:    Mary Porter is a 59 y.o. female with a hx of hyperlipidemia.  She recently has developed some palpitations with exercise.  We are asked to see her today by Dr. Dema Severin for further evaluation of these palpitations.  Labs from Dr. Dema Severin were reviewed.  She had a NMR lipid panel performed this week.  Her LDL particle number is 1470 .  The total cholesterol is 210.  The LDL 130.  Her HDL is 70.  Triglyceride levels 49. Vitamin D level is 23.3.  Friend of Bea Laura.  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   Has noticed that her heart is beating hard after several fast sets  Swam Rupert Stacks several years ago.    Has had episodes of tachycardia while standing in the kitchen .   Resolves with deep breaths .  Has noticed some tingling in her legs after swimming hard.  She has noticed some shortness of breath compared to how she felt last year.   Has been working out with a Physiological scientist .   gets winded when doing suicides with the trainer .     April 23, 2020: Mary Porter is seen today for follow-up visit.  She has a history of hyperlipidemia and has had some palpitations and shortness of breath with exercise.  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  Echocardiogram from February 29, 2020 reveals normal left ventricular systolic function.  She has grade 1 diastolic dysfunction.  She has no so that is why we ordered a valvular abnormalities.  Coronary calcium score of 178. This was 95th percentile for age and sex matched control. Calcium noted in the LAD and RCA.  Given her relatively high coronary calcium score we changed her atorvastatin to rosuvastatin.   We have scheduled her for an exercise Myoview study. We discussed the fact that endurance athletes tend to have higher coronary calcium scores.  She is able to exercise at a very high level without any angina.  She does get short of breath if she does multiple sets of sprints but she thinks that this is probably fairly normal for someone her age.   Past Medical History:  Diagnosis Date  . Acne rosacea   . BRCA1 negative 03/18/09   [facility] Breast Cancer - mother, MGM, MA, MGA X 2, Mcousin  . Cystocele   . Exercise-induced asthma   . Family history of BRCA1 gene positive   . Family history of breast cancer   . Family history of breast cancer in female   . Family history of ovarian cancer   . History of 2019 novel coronavirus disease (COVID-19)   . History of COVID-19   . Hypercholesterolemia   . Hyperlipidemia   . Hypertension   . Kidney stone 20's  . Nephrolithiasis   . Osteopenia   . Palpitations   . PMDD (premenstrual dysphoric disorder)   . PMS (premenstrual syndrome)   . Prediabetes   . Rectocele   . Torn meniscus    right knee  . Vitamin D deficiency     Past Surgical History:  Procedure  Laterality Date  . COLONOSCOPY    . INTRAUTERINE DEVICE (IUD) INSERTION  01/31/2018   Mirena   . VAGINAL DELIVERY     x3    Current Medications: Current Meds  Medication Sig  . Cholecalciferol (VITAMIN D3) 50 MCG (2000 UT) capsule Take 2,000 Units by mouth daily.  Marland Kitchen doxycycline (DORYX) 100 MG EC tablet Take 100 mg by mouth daily.  Marland Kitchen doxycycline (VIBRAMYCIN) 100 MG capsule Take 100 mg by mouth daily.  Marland Kitchen levonorgestrel (MIRENA) 20 MCG/24HR IUD 1 each by Intrauterine route once.  . Multiple Vitamin (MULTIVITAMIN) tablet Take 1 tablet by mouth daily.  . rosuvastatin (CRESTOR) 10 MG tablet Take 1 tablet (10 mg total) by mouth daily.  . valsartan (DIOVAN) 160 MG tablet Take 160 mg by mouth daily.  . VENTOLIN HFA 108 (90 Base) MCG/ACT inhaler inhale 2 puffs by mouth every 4 hours if  needed for DYSPNEA AND 2 PUFFS PRIOR TO EXERCISE     Allergies:   Patient has no known allergies.   Social History   Socioeconomic History  . Marital status: Married    Spouse name: Not on file  . Number of children: Not on file  . Years of education: Not on file  . Highest education level: Not on file  Occupational History  . Not on file  Tobacco Use  . Smoking status: Never Smoker  . Smokeless tobacco: Never Used  Vaping Use  . Vaping Use: Never used  Substance and Sexual Activity  . Alcohol use: Yes    Alcohol/week: 5.0 standard drinks    Types: 5 Glasses of wine per week  . Drug use: No  . Sexual activity: Yes    Birth control/protection: Surgical, I.U.D.    Comment: vasectomy  Other Topics Concern  . Not on file  Social History Narrative  . Not on file   Social Determinants of Health   Financial Resource Strain:   . Difficulty of Paying Living Expenses:   Food Insecurity:   . Worried About Charity fundraiser in the Last Year:   . Arboriculturist in the Last Year:   Transportation Needs:   . Film/video editor (Medical):   Marland Kitchen Lack of Transportation (Non-Medical):   Physical Activity:   . Days of Exercise per Week:   . Minutes of Exercise per Session:   Stress:   . Feeling of Stress :   Social Connections:   . Frequency of Communication with Friends and Family:   . Frequency of Social Gatherings with Friends and Family:   . Attends Religious Services:   . Active Member of Clubs or Organizations:   . Attends Archivist Meetings:   Marland Kitchen Marital Status:      Family History: The patient's family history includes BRCA 1/2 in her brother, cousin, maternal aunt, maternal aunt, maternal aunt, maternal grandfather, mother, sister, and sister; Breast cancer in her maternal aunt, maternal aunt, and sister; Breast cancer (age of onset: 16) in her cousin; Breast cancer (age of onset: 1) in her maternal aunt; Breast cancer (age of onset: 40) in her mother;  Cancer - Colon in her maternal grandfather; Heart disease in her father and paternal grandfather; Hypertension in her father; Ovarian cancer (age of onset: 36) in her cousin.  Father had cornonary stents  ROS:   Please see the history of present illness.     All other systems reviewed and are negative.  EKGs/Labs/Other Studies Reviewed:    The following  studies were reviewed today:   EKG:      February 15, 2020:   NSR at 89   Recent Labs: No results found for requested labs within last 8760 hours.  Recent Lipid Panel No results found for: CHOL, TRIG, HDL, CHOLHDL, VLDL, LDLCALC, LDLDIRECT  Physical Exam:    Physical Exam: Blood pressure 104/74, pulse 70, height '5\' 9"'  (1.753 m), weight 165 lb (74.8 kg), last menstrual period 01/31/2018.  GEN:  Well nourished, well developed in no acute distress HEENT: Normal NECK: No JVD; No carotid bruits LYMPHATICS: No lymphadenopathy CARDIAC: RRR   RESPIRATORY:  Clear to auscultation without rales, wheezing or rhonchi  ABDOMEN: Soft, non-tender, non-distended MUSCULOSKELETAL:  No edema; No deformity  SKIN: Warm and dry NEUROLOGIC:  Alert and oriented x 3    ASSESSMENT:    1. Hypercholesterolemia   2. DOE (dyspnea on exertion)   3. Family history of early CAD    PLAN:    In order of problems listed above:  1.  Coronary artery calcifications: Saki has a relatively high coronary artery calcium score.   Coronary calcium score of 178. This was 95th percentile for age and sex matched control. We will get a stress Myoview study for further evaluation and to evaluate her for ischemia.  I have advised her to give [place] a call if she has any episodes of chest tightness with exercise.  2.  Hyperlipidemia:   We changed her atorvastatin to rosuvastatin.  Will check lipids, liver enzymes, basic metabolic profile today.  I will see her again in 3 months for follow-up visit.  Medication Adjustments/Labs and Tests Ordered: Current medicines are  reviewed at length with the patient today.  Concerns regarding medicines are outlined above.  Orders Placed This Encounter  Procedures  . Lipid Profile  . Basic Metabolic Panel (BMET)  . Hepatic function panel   No orders of the defined types were placed in this encounter.   There are no Patient Instructions on file for this visit.   Signed, Mertie Moores, MD  04/23/2020 9:30 AM    [facility]

## 2020-04-23 NOTE — Patient Instructions (Addendum)
Medication Instructions:  Your physician recommends that you continue on your current medications as directed. Please refer to the Current Medication list given to you today.  *If you need a refill on your cardiac medications before your next appointment, please call your pharmacy*   Lab Work: TODAY - cholesterol, liver panel, basic metabolic panel If you have labs (blood work) drawn today and your tests are completely normal, you will receive your results only by: Marland Kitchen MyChart Message (if you have MyChart) OR . A paper copy in the mail If you have any lab test that is abnormal or we need to change your treatment, we will call you to review the results.   Testing/Procedures: Your physician has requested that you have an exercise stress myoview. For further information please visit [email or web address] Please follow instruction sheet, as given.    Follow-Up: At [facility], you and your health needs are our priority.  As part of our continuing mission to provide you with exceptional heart care, we have created designated Provider Care Teams.  These Care Teams include your primary Cardiologist (physician) and Advanced Practice Providers (APPs -  Physician Assistants and Nurse Practitioners) who all work together to provide you with the care you need, when you need it.  Your next appointment:   3 month(s) on Thursday Sept. 23 at 8:00 am  The format for your next appointment:   In Person  Provider:   Kristeen Miss, MD

## 2020-04-23 NOTE — Telephone Encounter (Signed)
-----   Message from Vesta Mixer, MD sent at 04/23/2020  5:47 PM EDT ----- LDL is better but is still elevated.     Add Zetia 10 mg a day  Recheck lipids , liver enz in 3 months .

## 2020-04-25 ENCOUNTER — Telehealth (HOSPITAL_COMMUNITY): Payer: Self-pay | Admitting: *Deleted

## 2020-04-25 NOTE — Telephone Encounter (Signed)
Patient given detailed instructions per Myocardial Perfusion Study Information Sheet for the test on 05/01/20. Patient notified to arrive 15 minutes early and that it is imperative to arrive on time for appointment to keep from having the test rescheduled. ° If you need to cancel or reschedule your appointment, please call the office within 24 hours of your appointment. . Patient verbalized understanding. Mireille Lacombe Jacqueline ° ° ° ° ° °

## 2020-05-01 ENCOUNTER — Ambulatory Visit (HOSPITAL_COMMUNITY): Payer: 59 | Attending: Internal Medicine

## 2020-05-01 ENCOUNTER — Other Ambulatory Visit: Payer: Self-pay

## 2020-05-01 DIAGNOSIS — Z8249 Family history of ischemic heart disease and other diseases of the circulatory system: Secondary | ICD-10-CM | POA: Insufficient documentation

## 2020-05-01 DIAGNOSIS — R0600 Dyspnea, unspecified: Secondary | ICD-10-CM | POA: Insufficient documentation

## 2020-05-01 DIAGNOSIS — R0609 Other forms of dyspnea: Secondary | ICD-10-CM

## 2020-05-01 LAB — MYOCARDIAL PERFUSION IMAGING
Estimated workload: 15.3 METS
Exercise duration (min): 13 min
LV dias vol: 77 mL (ref 46–106)
LV sys vol: 32 mL
MPHR: 162 {beats}/min
Peak HR: 160 {beats}/min
Percent HR: 98 %
RPE: 20
Rest HR: 61 {beats}/min
SDS: 1
SRS: 0
SSS: 1
TID: 0.96

## 2020-05-01 MED ORDER — TECHNETIUM TC 99M TETROFOSMIN IV KIT
32.5000 | PACK | Freq: Once | INTRAVENOUS | Status: AC | PRN
  Administered 2020-05-01: 32.5 via INTRAVENOUS
  Filled 2020-05-01: qty 33

## 2020-05-01 MED ORDER — TECHNETIUM TC 99M TETROFOSMIN IV KIT
10.1000 | PACK | Freq: Once | INTRAVENOUS | Status: AC | PRN
  Administered 2020-05-01: 10.1 via INTRAVENOUS
  Filled 2020-05-01: qty 11

## 2020-05-02 ENCOUNTER — Telehealth: Payer: Self-pay | Admitting: Obstetrics and Gynecology

## 2020-05-02 NOTE — Telephone Encounter (Signed)
Patient has "been gaining weight since Mirena insertion" and would like to discuss with Dr.Jertson.

## 2020-05-02 NOTE — Telephone Encounter (Signed)
Mirena IUD placed 01/2018 for menorrhagia. Perimenopausal 10/2019 AEX 10/2019, next scheduled for 10/10/20   Spoke with pt. Pt states having weight gain, feeling "full", bloating since having IUD placed. Pt states also having some vasomotor sx that have come and gone like night sweats and hot flashes. Denies any vaginal dryness or other vasomotor sx. Pt states works out 8 x a week and cannot loose weight. Has made changes in her diet too. Pt states had light spotting with IUD monthly since placement, but had normal, regular flow cycle x 2 weeks ago. Denies any heavy bleeding or clots.   Pt advised to have OV to discuss further with Dr Oscar La and possible hormone labs. Pt agreeable. Pt unsure if IUD needs to come out or not and states does not want to have significant bleeding like before IUD inserted.  Advised can discuss possible IUD removal at appt.  Pt scheduled with Dr Oscar La on 7/9 at 4pm. Pt verbalized understanding of date and time of appt.   Routing to Dr Oscar La for review.  Encounter closed.

## 2020-05-10 ENCOUNTER — Encounter: Payer: Self-pay | Admitting: Obstetrics and Gynecology

## 2020-05-10 ENCOUNTER — Other Ambulatory Visit: Payer: Self-pay

## 2020-05-10 ENCOUNTER — Ambulatory Visit (INDEPENDENT_AMBULATORY_CARE_PROVIDER_SITE_OTHER): Payer: BC Managed Care – PPO | Admitting: Obstetrics and Gynecology

## 2020-05-10 DIAGNOSIS — Z3009 Encounter for other general counseling and advice on contraception: Secondary | ICD-10-CM | POA: Diagnosis not present

## 2020-05-10 DIAGNOSIS — R635 Abnormal weight gain: Secondary | ICD-10-CM

## 2020-05-10 NOTE — Progress Notes (Signed)
GYNECOLOGY  VISIT   HPI: 59 y.o.   Married White or Caucasian Not Hispanic or Latino  female   [phone] with Patient's last menstrual period was 01/31/2018.   here for weight gain consult would like blood work.  She had a mirena IUD placed in 4/19 for treatment of menorrhagia.  She feels like she has gained weight since the IUD was inserted. States her weight goes between 165-170. Feels her clothes are tight and her breasts are full.  In the past 6 weeks she has been working out 8-10 x a week, isn't loosing weight.  She was having some hot flashes, then they went away. No vasomotor symptoms for months. No vaginal dryness. Spotting some, minimal. Spotting for the last 2 weeks, but can go months with without it.  No other c/o. No constipation, no dry skin, no hair loss. She has some fatigue, but thinks it is from her life. She does drink wine, not more than 2 a day.   GYNECOLOGIC HISTORY: Patient's last menstrual period was 01/31/2018. Contraception:IUD Menopausal hormone therapy: none         OB History    Gravida  6   Para  6   Term  3   Preterm  0   AB  0   Living  3     SAB  0   TAB  0   Ectopic  0   Multiple  0   Live Births  3              Patient Active Problem List   Diagnosis Date Noted  . Coronary artery calcification 04/23/2020  . Hyperlipidemia 04/23/2020  . Family history of breast cancer   . Family history of BRCA1 gene positive   . Family history of ovarian cancer     Past Medical History:  Diagnosis Date  . Acne rosacea   . BRCA1 negative 03/18/09   [place] of Breast Cancer - mother, MGM, MA, MGA X 2, Mcousin  . Cystocele   . Exercise-induced asthma   . Family history of BRCA1 gene positive   . Family history of breast cancer   . Family history of breast cancer in female   . Family history of ovarian cancer   . History of 2019 novel coronavirus disease (COVID-19)   . History of COVID-19   . Hypercholesterolemia   . Hyperlipidemia   .  Hypertension   . Kidney stone 20's  . Nephrolithiasis   . Osteopenia   . Palpitations   . PMDD (premenstrual dysphoric disorder)   . PMS (premenstrual syndrome)   . Prediabetes   . Rectocele   . Torn meniscus    right knee  . Vitamin D deficiency     Past Surgical History:  Procedure Laterality Date  . COLONOSCOPY    . INTRAUTERINE DEVICE (IUD) INSERTION  01/31/2018   Mirena   . VAGINAL DELIVERY     x3    Current Outpatient Medications  Medication Sig Dispense Refill  . Cholecalciferol (VITAMIN D3) 50 MCG (2000 UT) capsule Take 2,000 Units by mouth daily.    Marland Kitchen ezetimibe (ZETIA) 10 MG tablet Take 1 tablet (10 mg total) by mouth daily. 90 tablet 3  . levonorgestrel (MIRENA) 20 MCG/24HR IUD 1 each by Intrauterine route once.    . rosuvastatin (CRESTOR) 10 MG tablet Take 1 tablet (10 mg total) by mouth daily. 90 tablet 3  . valsartan (DIOVAN) 160 MG tablet Take 160 mg by mouth daily.  5  . VENTOLIN HFA 108 (90 Base) MCG/ACT inhaler inhale 2 puffs by mouth every 4 hours if needed for DYSPNEA AND 2 PUFFS PRIOR TO EXERCISE  0  . doxycycline (DORYX) 100 MG EC tablet Take 100 mg by mouth daily.     No current facility-administered medications for this visit.     ALLERGIES: Patient has no known allergies.  Family History  Problem Relation Age of Onset  . Breast cancer Mother 35       BRCA +  . BRCA 1/2 Mother        BRCA1 Pos  . Breast cancer Maternal Aunt 60       BRCA +  . BRCA 1/2 Maternal Aunt        BRCA1 pos  . Cancer - Colon Maternal Grandfather        colon cancer, BRCA +  . BRCA 1/2 Maternal Grandfather        BRCA1 pos  . Heart disease Father   . Hypertension Father   . Breast cancer Cousin 25       maternal, BRCA -  . BRCA 1/2 Cousin        BRCA Neg  . Ovarian cancer Cousin 25       dec  . Breast cancer Maternal Aunt        BRCA +  . BRCA 1/2 Maternal Aunt        BRCA1 pos  . Breast cancer Maternal Aunt        BRCA +  . BRCA 1/2 Maternal Aunt         BRCA1 pos  . Breast cancer Sister        BRCA -  . BRCA 1/2 Sister        BRCA NEG  . BRCA 1/2 Brother        BRCA1 pos  . Heart disease Paternal Grandfather   . BRCA 1/2 Sister        BRCA Pos    Social History   Socioeconomic History  . Marital status: Married    Spouse name: Not on file  . Number of children: Not on file  . Years of education: Not on file  . Highest education level: Not on file  Occupational History  . Not on file  Tobacco Use  . Smoking status: Never Smoker  . Smokeless tobacco: Never Used  Vaping Use  . Vaping Use: Never used  Substance and Sexual Activity  . Alcohol use: Yes    Alcohol/week: 5.0 standard drinks    Types: 5 Glasses of wine per week  . Drug use: No  . Sexual activity: Yes    Birth control/protection: Surgical, I.U.D.    Comment: vasectomy  Other Topics Concern  . Not on file  Social History Narrative  . Not on file   Social Determinants of Health   Financial Resource Strain:   . Difficulty of Paying Living Expenses:   Food Insecurity:   . Worried About Charity fundraiser in the Last Year:   . Arboriculturist in the Last Year:   Transportation Needs:   . Film/video editor (Medical):   Marland Kitchen Lack of Transportation (Non-Medical):   Physical Activity:   . Days of Exercise per Week:   . Minutes of Exercise per Session:   Stress:   . Feeling of Stress :   Social Connections:   . Frequency of Communication with Friends and Family:   . Frequency  of Social Gatherings with Friends and Family:   . Attends Religious Services:   . Active Member of Clubs or Organizations:   . Attends Archivist Meetings:   Marland Kitchen Marital Status:   Intimate Partner Violence:   . Fear of Current or Ex-Partner:   . Emotionally Abused:   Marland Kitchen Physically Abused:   . Sexually Abused:     Review of Systems  Constitutional:       Weight gain  All other systems reviewed and are negative.   PHYSICAL EXAMINATION:   Declined vitals and  weight LMP 01/31/2018     General appearance: alert, cooperative and appears stated age  ASSESSMENT C/O weight gain since mirena IUD was inserted. On review of her chart her weight was 168 the day her IUD was inserted and was down to 165 lbs 7 months later. I suspect that the weight gain isn't from the [place].     PLAN Check TSH and FSH Discussed eating a healthy diet, portion control, watching ETOH intake.    ~18 minutes in total patient care.

## 2020-05-11 LAB — FOLLICLE STIMULATING HORMONE: FSH: 63.8 m[IU]/mL

## 2020-05-11 LAB — TSH: TSH: 1.94 u[IU]/mL (ref 0.450–4.500)

## 2020-06-20 ENCOUNTER — Telehealth: Payer: Self-pay | Admitting: Cardiovascular Disease

## 2020-06-20 NOTE — Telephone Encounter (Signed)
Could you please get the name of the medication? Thanks

## 2020-06-20 NOTE — Telephone Encounter (Signed)
Called pt and left message asking pt to call back to give the name of the medication that she needs to be refilled. Both of the cholesterol medications have been refilled and sent to her pharmacies.

## 2020-06-20 NOTE — Telephone Encounter (Signed)
I went through her medication list with her and she was unsure of the name. I'm sorry but I am not clinical - I'm not sure which medication she is referring to.

## 2020-06-20 NOTE — Telephone Encounter (Signed)
Patient cannot remember the name of the medication she is trying to sent to her pharmacy. She states it is not a statin but it is a medication that goes along with the statin. She states she doesn't believe it was ever called in. Unsure of which medication she is referring to. Please advise. She uses [id number] - [place], [place] - [address] AT [facility] OF [address]

## 2020-06-21 ENCOUNTER — Other Ambulatory Visit: Payer: Self-pay | Admitting: Family Medicine

## 2020-06-21 DIAGNOSIS — Z1231 Encounter for screening mammogram for malignant neoplasm of breast: Secondary | ICD-10-CM

## 2020-06-24 ENCOUNTER — Other Ambulatory Visit: Payer: Self-pay

## 2020-06-24 ENCOUNTER — Ambulatory Visit
Admission: RE | Admit: 2020-06-24 | Discharge: 2020-06-24 | Disposition: A | Discharge status: Home / Self Care | Payer: BC Managed Care – PPO | Source: Ambulatory Visit

## 2020-06-24 DIAGNOSIS — Z1231 Encounter for screening mammogram for malignant neoplasm of breast: Secondary | ICD-10-CM

## 2020-07-16 ENCOUNTER — Other Ambulatory Visit: Payer: Self-pay | Admitting: Nurse Practitioner

## 2020-07-16 DIAGNOSIS — E7800 Pure hypercholesterolemia, unspecified: Secondary | ICD-10-CM

## 2020-07-16 DIAGNOSIS — I2510 Atherosclerotic heart disease of native coronary artery without angina pectoris: Secondary | ICD-10-CM

## 2020-07-16 DIAGNOSIS — Z8249 Family history of ischemic heart disease and other diseases of the circulatory system: Secondary | ICD-10-CM

## 2020-07-23 ENCOUNTER — Other Ambulatory Visit: Payer: BC Managed Care – PPO

## 2020-07-23 ENCOUNTER — Other Ambulatory Visit: Payer: Self-pay

## 2020-07-23 DIAGNOSIS — E7800 Pure hypercholesterolemia, unspecified: Secondary | ICD-10-CM | POA: Diagnosis not present

## 2020-07-23 DIAGNOSIS — I2584 Coronary atherosclerosis due to calcified coronary lesion: Secondary | ICD-10-CM | POA: Diagnosis not present

## 2020-07-23 DIAGNOSIS — Z8249 Family history of ischemic heart disease and other diseases of the circulatory system: Secondary | ICD-10-CM | POA: Diagnosis not present

## 2020-07-23 DIAGNOSIS — I2510 Atherosclerotic heart disease of native coronary artery without angina pectoris: Secondary | ICD-10-CM | POA: Diagnosis not present

## 2020-07-23 LAB — BASIC METABOLIC PANEL
BUN/Creatinine Ratio: 15 (ref 9–23)
BUN: 15 mg/dL (ref 6–24)
CO2: 24 mmol/L (ref 20–29)
Calcium: 9.5 mg/dL (ref 8.7–10.2)
Chloride: 106 mmol/L (ref 96–106)
Creatinine, Ser: 0.99 mg/dL (ref 0.57–1.00)
GFR calc Af Amer: 73 mL/min/{1.73_m2} (ref >59)
GFR calc non Af Amer: 63 mL/min/{1.73_m2} (ref >59)
Glucose: 128 mg/dL — ABNORMAL HIGH (ref 65–99)
Potassium: 4.5 mmol/L (ref 3.5–5.2)
Sodium: 139 mmol/L (ref 134–144)

## 2020-07-23 LAB — LIPID PANEL
Chol/HDL Ratio: 2.7 ratio (ref 0.0–4.4)
Cholesterol, Total: 185 mg/dL (ref 100–199)
HDL: 69 mg/dL (ref >39)
LDL Chol Calc (NIH): 103 mg/dL — ABNORMAL HIGH (ref 0–99)
Triglycerides: 70 mg/dL (ref 0–149)
VLDL Cholesterol Cal: 13 mg/dL (ref 5–40)

## 2020-07-23 LAB — HEPATIC FUNCTION PANEL
ALT: 15 IU/L (ref 0–32)
AST: 17 IU/L (ref 0–40)
Albumin: 4.3 g/dL (ref 3.8–4.9)
Alkaline Phosphatase: 56 IU/L (ref 44–121)
Bilirubin Total: 0.6 mg/dL (ref 0.0–1.2)
Bilirubin, Direct: 0.16 mg/dL (ref 0.00–0.40)
Total Protein: 6.7 g/dL (ref 6.0–8.5)

## 2020-07-24 ENCOUNTER — Encounter: Payer: Self-pay | Admitting: Cardiovascular Disease

## 2020-07-24 NOTE — Progress Notes (Signed)
Cardiology Office Note:    Date:  07/25/2020   ID:  Mary Porter, DOB November 05, 1960, MRN [id number]  PCP:  Harlan Stains, MD  Cardiologist:  Theda Payer  Electrophysiologist:  None   Referring MD: Harlan Stains, MD   Chief Complaint  Patient presents with   Hyperlipidemia    02/15/20    Mary Porter is a 59 y.o. female with a hx of hyperlipidemia.  She recently has developed some palpitations with exercise.  We are asked to see her today by Dr. Dema Severin for further evaluation of these palpitations.  Labs from Dr. Dema Severin were reviewed.  She had a NMR lipid panel performed this week.  Her LDL particle number is 1470 .  The total cholesterol is 210.  The LDL 130.  Her HDL is 70.  Triglyceride levels 49. Vitamin D level is 23.3.  Friend of Bea Laura.  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   Has noticed that her heart is beating hard after several fast sets  Swam [place] several years ago.    Has had episodes of tachycardia while standing in the kitchen .   Resolves with deep breaths .  Has noticed some tingling in her legs after swimming hard.  She has noticed some shortness of breath compared to how she felt last year.   Has been working out with a Physiological scientist .   gets winded when doing suicides with the trainer .     April 23, 2020: Mary Porter is seen today for follow-up visit.  She has a history of hyperlipidemia and has had some palpitations and shortness of breath with exercise.  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  Echocardiogram from February 29, 2020 reveals normal left ventricular systolic function.  She has grade 1 diastolic dysfunction.  She has no so that is why we ordered a valvular abnormalities.  Coronary calcium score of 178. This was 95th percentile for age and sex matched control. Calcium noted in the LAD and RCA.  Given her relatively high coronary calcium score we changed her atorvastatin to rosuvastatin.  We have scheduled  her for an exercise Myoview study. We discussed the fact that endurance athletes tend to have higher coronary calcium scores.  She is able to exercise at a very high level without any angina.  She does get short of breath if she does multiple sets of sprints but she thinks that this is probably fairly normal for someone her age.  Sept, 23, 2021;  Mary Porter is seen toda for follow  Up of her hyperlipidemia, elevated coronary calcium score. She is a long Wellsite geologist. Stress myoview showed excellent exercise capacity with no ischemia ,  Walked for 13 minutes.   Troubled by having to wear a mask during the treadmill.  Her LDL remains mildl elevated  .  103,  She never picked up her zetia.  Lipids look the same as her previous levels  Her goal is <70      Past Medical History:  Diagnosis Date   Acne rosacea    BRCA1 negative 03/18/09   [place] of Breast Cancer - mother, MGM, MA, MGA X 2, Mcousin   Cystocele    Exercise-induced asthma    Family history of BRCA1 gene positive    Family history of breast cancer    Family history of breast cancer in female    Family history of ovarian cancer    History of 2019  novel coronavirus disease (COVID-19)    History of COVID-19    Hypercholesterolemia    Hyperlipidemia    Hypertension    Kidney stone 20's   Nephrolithiasis    Osteopenia    Palpitations    PMDD (premenstrual dysphoric disorder)    PMS (premenstrual syndrome)    Prediabetes    Rectocele    Torn meniscus    right knee   Vitamin D deficiency     Past Surgical History:  Procedure Laterality Date   COLONOSCOPY     INTRAUTERINE DEVICE (IUD) INSERTION  01/31/2018   Mirena    VAGINAL DELIVERY     x3    Current Medications: Current Meds  Medication Sig   Cholecalciferol (VITAMIN D3) 50 MCG (2000 UT) capsule Take 2,000 Units by mouth daily.   doxycycline (DORYX) 100 MG EC tablet Take 100 mg by mouth daily.   ezetimibe (ZETIA) 10 MG tablet  Take 1 tablet (10 mg total) by mouth daily.   levonorgestrel (MIRENA) 20 MCG/24HR IUD 1 each by Intrauterine route once.   rosuvastatin (CRESTOR) 20 MG tablet Take 1 tablet (20 mg total) by mouth daily.   valsartan (DIOVAN) 160 MG tablet Take 160 mg by mouth daily.   VENTOLIN HFA 108 (90 Base) MCG/ACT inhaler inhale 2 puffs by mouth every 4 hours if needed for DYSPNEA AND 2 PUFFS PRIOR TO EXERCISE   [DISCONTINUED] ezetimibe (ZETIA) 10 MG tablet Take 1 tablet (10 mg total) by mouth daily.   [DISCONTINUED] rosuvastatin (CRESTOR) 10 MG tablet Take 1 tablet (10 mg total) by mouth daily.     Allergies:   Patient has no known allergies.   Social History   Socioeconomic History   Marital status: Married    Spouse name: Not on file   Number of children: Not on file   Years of education: Not on file   Highest education level: Not on file  Occupational History   Not on file  Tobacco Use   Smoking status: Never Smoker   Smokeless tobacco: Never Used  Vaping Use   Vaping Use: Never used  Substance and Sexual Activity   Alcohol use: Yes    Alcohol/week: 5.0 standard drinks    Types: 5 Glasses of wine per week   Drug use: No   Sexual activity: Yes    Birth control/protection: Surgical, I.U.D.    Comment: vasectomy  Other Topics Concern   Not on file  Social History Narrative   Not on file   Social Determinants of Health   Financial Resource Strain:    Difficulty of Paying Living Expenses: Not on file  Food Insecurity:    Worried About [place] in the Last Year: Not on file   Ran Out of Food in the Last Year: Not on file  Transportation Needs:    Lack of Transportation (Medical): Not on file   Lack of Transportation (Non-Medical): Not on file  Physical Activity:    Days of Exercise per Week: Not on file   Minutes of Exercise per Session: Not on file  Stress:    Feeling of Stress : Not on file  Social Connections:    Frequency of  Communication with Friends and Family: Not on file   Frequency of Social Gatherings with Friends and Family: Not on file   Attends Religious Services: Not on file   Active Member of Clubs or Organizations: Not on file   Attends Archivist Meetings: Not on file  Marital Status: Not on file     Family History: The patient's family history includes BRCA 1/2 in her brother, cousin, maternal aunt, maternal aunt, maternal aunt, maternal grandfather, mother, sister, and sister; Breast cancer in her maternal aunt, maternal aunt, and sister; Breast cancer (age of onset: 4) in her cousin; Breast cancer (age of onset: 51) in her maternal aunt; Breast cancer (age of onset: 85) in her mother; Cancer - Colon in her maternal grandfather; Heart disease in her father and paternal grandfather; Hypertension in her father; Ovarian cancer (age of onset: 40) in her cousin.  Father had cornonary stents  ROS:   Please see the history of present illness.     All other systems reviewed and are negative.  EKGs/Labs/Other Studies Reviewed:    The following studies were reviewed today:   EKG:         Recent Labs: 05/10/2020: TSH 1.940 07/23/2020: ALT 15; BUN 15; Creatinine, Ser 0.99; Potassium 4.5; Sodium 139  Recent Lipid Panel    Component Value Date/Time   CHOL 185 07/23/2020 0739   TRIG 70 07/23/2020 0739   HDL 69 07/23/2020 0739   CHOLHDL 2.7 07/23/2020 0739   LDLCALC 103 (H) 07/23/2020 0739    Physical Exam:    Physical Exam: Blood pressure 108/68, pulse 78, height '5\' 9"'  (1.753 m), weight 165 lb (74.8 kg), SpO2 97 %.  GEN:  Well nourished, well developed in no acute distress HEENT: Normal NECK: No JVD; No carotid bruits LYMPHATICS: No lymphadenopathy CARDIAC: RRR , no murmurs, rubs, gallops RESPIRATORY:  Clear to auscultation without rales, wheezing or rhonchi  ABDOMEN: Soft, non-tender, non-distended MUSCULOSKELETAL:  No edema; No deformity  SKIN: Warm and dry NEUROLOGIC:   Alert and oriented x 3    ASSESSMENT:    1. Hypercholesterolemia    PLAN:    In order of problems listed above:  1.  Coronary artery calcifications: [place] has a relatively high coronary artery calcium score.   Coronary calcium score of 178. This was 95th percentile for age and sex matched control. Stress myoview was negative for ischemia Will cont with aggressive lipid management   2.  Hyperlipidemia:     She never picked up her zetia  Will increase her rosuvastatin to 20 mg , zetia 10 mg a day  Recheck  labs in 3 months   I'll see her in 3 month  With lipids, liver, bmp    Medication Adjustments/Labs and Tests Ordered: Current medicines are reviewed at length with the patient today.  Concerns regarding medicines are outlined above.  Orders Placed This Encounter  Procedures   Lipid panel   Hepatic function panel   Basic metabolic panel   Meds ordered this encounter  Medications   ezetimibe (ZETIA) 10 MG tablet    Sig: Take 1 tablet (10 mg total) by mouth daily.    Dispense:  90 tablet    Refill:  3   rosuvastatin (CRESTOR) 20 MG tablet    Sig: Take 1 tablet (20 mg total) by mouth daily.    Dispense:  90 tablet    Refill:  3    Patient Instructions  Medication Instructions:  1) START ZETIA 10 mg daily 2) INCREASE ROSUVASTATIN to 20 mg daily  *If you need a refill on your cardiac medications before your next appointment, please call your pharmacy*  Lab Work: Your provider recommends that you return for FASTING lab work in 3 months. If you have labs (blood work) drawn today and  your tests are completely normal, you will receive your results only by:  MyChart Message (if you have MyChart) OR  A paper copy in the mail If you have any lab test that is abnormal or we need to change your treatment, we will call you to review the results.  Follow-Up: Your provider recommends that you schedule a follow-up appointment in 3 months with Dr. Acie Fredrickson.      Signed, Mertie Moores, MD  07/25/2020 11:27 AM    [place]

## 2020-07-25 ENCOUNTER — Other Ambulatory Visit: Payer: Self-pay

## 2020-07-25 ENCOUNTER — Ambulatory Visit (INDEPENDENT_AMBULATORY_CARE_PROVIDER_SITE_OTHER): Payer: BC Managed Care – PPO | Admitting: Cardiovascular Disease

## 2020-07-25 ENCOUNTER — Encounter: Payer: Self-pay | Admitting: Cardiovascular Disease

## 2020-07-25 VITALS — BP 108/68 | HR 78 | Ht 69.0 in | Wt 165.0 lb

## 2020-07-25 DIAGNOSIS — E7800 Pure hypercholesterolemia, unspecified: Secondary | ICD-10-CM

## 2020-07-25 DIAGNOSIS — I2584 Coronary atherosclerosis due to calcified coronary lesion: Secondary | ICD-10-CM

## 2020-07-25 DIAGNOSIS — I2510 Atherosclerotic heart disease of native coronary artery without angina pectoris: Secondary | ICD-10-CM | POA: Diagnosis not present

## 2020-07-25 DIAGNOSIS — E782 Mixed hyperlipidemia: Secondary | ICD-10-CM | POA: Diagnosis not present

## 2020-07-25 MED ORDER — EZETIMIBE 10 MG PO TABS: 10.0000 mg | ORAL_TABLET | Freq: Every day | ORAL | 3 refills | Status: DC

## 2020-07-25 MED ORDER — ROSUVASTATIN CALCIUM 20 MG PO TABS: 20.0000 mg | ORAL_TABLET | Freq: Every day | ORAL | 3 refills | Status: DC

## 2020-07-25 NOTE — Patient Instructions (Signed)
Medication Instructions:  1) START ZETIA 10 mg daily 2) INCREASE ROSUVASTATIN to 20 mg daily  *If you need a refill on your cardiac medications before your next appointment, please call your pharmacy*  Lab Work: Your provider recommends that you return for FASTING lab work in 3 months. If you have labs (blood work) drawn today and your tests are completely normal, you will receive your results only by: Marland Kitchen MyChart Message (if you have MyChart) OR . A paper copy in the mail If you have any lab test that is abnormal or we need to change your treatment, we will call you to review the results.  Follow-Up: Your provider recommends that you schedule a follow-up appointment in 3 months with Dr. Elease Hashimoto.

## 2020-08-14 ENCOUNTER — Telehealth: Payer: Self-pay

## 2020-08-14 DIAGNOSIS — N924 Excessive bleeding in the premenopausal period: Secondary | ICD-10-CM

## 2020-08-14 NOTE — Telephone Encounter (Signed)
AEX 10/2019, next scheduled for 10/10/20 with JJ Mirena IUD placed 01/2018 perimenopause per last labs in 05/2020  Spoke with pt. Pt states having IUD fall out this morning after swimming. Pt states saw strings and thought she left in old tampon. Pt states " barely" pulled and IUD fell out. Denies any pain or cramps. Pt now only having vaginal light spotting and wearing a panty liner that she has not soaked. Denies heavy bleeding and clots, vaginal discharge or odor. Pt states has felt different the last week and maybe it was IUD working its way out. Advised pt to calendar bleeding and sx. Pt verbalized understanding.   Pt states also having lots of increased stressed lately due to moving and having headache for past 2 days. Took home Covid test that was negative. Denies all other Covid sx.  Pt advised to have OV. Pt states IUD intact with arms and string attached. Pt declines OV at this time due to being in [place]. Pt asking if ok to discuss hormones at AEX in December and decide if needs new IUD placed. Pt states having some vasomotor sx like hot flashes but not sure if hormones. Pt given ER precautions for heavy bleeding. Pt agreeable and verbalized understanding. Pt advised will review with Dr Oscar La and return call with any further recommendations/advice. Pt agreeable.   Routing to Dr Oscar La.

## 2020-08-14 NOTE — Telephone Encounter (Signed)
Patient says IUD fell out.

## 2020-08-15 NOTE — Telephone Encounter (Signed)
I would offer her an [facility] level prior to December if desired. When the IUD was place 2.5 years ago she was having menorrhagia.  If she would prefer to wait that is also fine.

## 2020-08-15 NOTE — Telephone Encounter (Signed)
Spoke with pt. Pt given update per Dr Oscar La. Pt agreeable to have FSH prior to AEX in Dec. Pt scheduled for 10/18 at 845 am. Pt verbalized understanding to date and time of appt.  Encounter closed. Future orders placed.

## 2020-08-19 ENCOUNTER — Other Ambulatory Visit: Payer: Self-pay

## 2020-08-19 ENCOUNTER — Other Ambulatory Visit (INDEPENDENT_AMBULATORY_CARE_PROVIDER_SITE_OTHER): Payer: BC Managed Care – PPO

## 2020-08-19 DIAGNOSIS — N924 Excessive bleeding in the premenopausal period: Secondary | ICD-10-CM | POA: Diagnosis not present

## 2020-08-20 LAB — FOLLICLE STIMULATING HORMONE: FSH: 122 m[IU]/mL

## 2020-08-21 ENCOUNTER — Telehealth: Payer: Self-pay

## 2020-08-21 MED ORDER — MEDROXYPROGESTERONE ACETATE 5 MG PO TABS: ORAL_TABLET | ORAL | 0 refills | Status: DC

## 2020-08-21 NOTE — Telephone Encounter (Signed)
New message    Returning call back to the nurse.  

## 2020-08-21 NOTE — Telephone Encounter (Signed)
Spoke with pt. Pt given results and recommendations per Dr Oscar La. Pt states husband has vasectomy. Pt agreeable to take Provera in early Dec and will monitor and calendar bleeding, pt verbalized understanding. Pt has AEX on 12/9 with Dr Mickeal Skinner. Pt agreeable to date and time of appt.  Provera Rx 5 mg #5, 0RF sent to pharmacy on file.  Encounter closed   Romualdo Bolk, MD  08/21/2020 12:53 PM EDT     The patient's mirena IUD fell out last week. She had it placed in 4/19 for menorrhagia. Please let the patient know that her FSH is in the menopausal range. I can't tell her from this # if she is peri or postmenopausal because the # can fluctuate. I don't think she is at risk of pregnancy. I would offer her provera 5 mg x 5 days to take in early December to see if she has a withdrawal bleed. She should let us know if she has a spontaneous cycle.

## 2020-10-01 ENCOUNTER — Telehealth: Payer: Self-pay

## 2020-10-01 NOTE — Telephone Encounter (Signed)
Spoke with pt. Pt given update and recommendations per Dr Oscar La. Pt agreeable and verbalized understanding. Pt will continue to record any bleeding and discuss at AEX on 10/10/20. Encounter closed

## 2020-10-01 NOTE — Telephone Encounter (Signed)
Patient says she need a prescription for the medication that she is supposed to take five days before her aex. She is scheduled 10/10/20.

## 2020-10-01 NOTE — Telephone Encounter (Signed)
I think since she has an appointment in 1.5 weeks, I would just recommend we evaluate her and discuss further at this visit. It would be helpful if she has recorded when she bleed, I would like to see if it appears cyclic.

## 2020-10-01 NOTE — Telephone Encounter (Signed)
Spoke with pt. Pt states wanting to know if needs Rx Provera before AEX that is scheduled on 10/10/20. Per reviewed notes below. Advised pt will review with Dr Oscar La and return call.   IUD out 08/14/20 Pt states having hot flashes, intermittent night sweats Had vaginal bleeding 5 days with wearing panty liner only changing twice a day. States this has happened x 3 since IUD out in Oct. Denies abd cramps, heavy bleeding or clots. Had SA last night, denies pain with intercourse, states " has good moisture".   Routing to Dr Oscar La, please advise on Rx Provera?  Romualdo Bolk, MD  08/21/2020 12:53 PM EDT     The patient's mirena IUD fell out last week. She had it placed in 4/19 for menorrhagia. Please let the patient know that her FSH is in the menopausal range. I can't tell her from this # if she is peri or postmenopausal because the # can fluctuate. I don't think she is at risk of pregnancy. I would offer her provera 5 mg x 5 days to take in early December to see if she has a withdrawal bleed. She should let us know if she has a spontaneous cycle.

## 2020-10-09 NOTE — Progress Notes (Signed)
59 y.o. [id number] Married White or Caucasian Not Hispanic or Latino female here for annual exam.  Patient says that she thought that her IUD string was a tampon and she pulled it out. IUD was intact She states that she has been having some hot flashes, tolerable. No night sweats. No vaginal dryness. No dyspareunia.  She had a mirena IUD placed in 4/19 for menorrhagia. She accidentally pulled her IUD out in 10/21. She spotted for a couple of weeks after the IUD came out. No spotting for 2.5-3 weeks.   She had an elevated glucose in 9/21 of 128. She is seeing the Cardiologist tomorrow and will get a HgbA1C.   H/O rectocele, no symptomatic. Normal bowels.  Occasional urinary incontinence, GSI only with a full bladder.     No LMP recorded. Patient is perimenopausal.          Sexually active: Yes.    The current method of family planning is none.    Exercising: Yes.    Swimming, Personal training  Smoker:  no  Health Maintenance: Pap:  09/20/2018 WNL NEG HPV,  04/30/15 negative with neg HR HPV  History of abnormal Pap:  no MMG:  06/24/20 density C Bi-rads 1 neg  BMD:  04/10/16, -0.2 Spine / -0.5 Left Femur Neck Colonoscopy: 05/10/12, normal, repeat in 10 years TDaP:  03/2014    reports that she has never smoked. She has never used smokeless tobacco. She reports current alcohol use of about 5.0 standard drinks of alcohol per week. She reports that she does not use drugs. She is a Forensic psychologist, doing well.   Past Medical History:  Diagnosis Date  . Acne rosacea   . BRCA1 negative 03/18/09   [facility] - mother, MGM, MA, MGA X 2, Mcousin  . Cystocele   . Exercise-induced asthma   . Family history of BRCA1 gene positive   . Family history of breast cancer   . Family history of breast cancer in female   . Family history of ovarian cancer   . History of 2019 novel coronavirus disease (COVID-19)   . History of COVID-19   . Hypercholesterolemia   . Hyperlipidemia   . Hypertension    . Kidney stone 20's  . Nephrolithiasis   . Osteopenia   . Palpitations   . PMDD (premenstrual dysphoric disorder)   . PMS (premenstrual syndrome)   . Prediabetes   . Rectocele   . Torn meniscus    right knee  . Vitamin D deficiency     Past Surgical History:  Procedure Laterality Date  . COLONOSCOPY    . INTRAUTERINE DEVICE (IUD) INSERTION  01/31/2018   Mirena   . VAGINAL DELIVERY     x3    Current Outpatient Medications  Medication Sig Dispense Refill  . Cholecalciferol (VITAMIN D3) 50 MCG (2000 UT) capsule Take 2,000 Units by mouth daily.    Marland Kitchen doxycycline (DORYX) 100 MG EC tablet Take 100 mg by mouth daily.    Marland Kitchen ezetimibe (ZETIA) 10 MG tablet Take 1 tablet (10 mg total) by mouth daily. 90 tablet 3  . rosuvastatin (CRESTOR) 20 MG tablet Take 1 tablet (20 mg total) by mouth daily. 90 tablet 3  . valsartan (DIOVAN) 160 MG tablet Take 160 mg by mouth daily.  5  . medroxyPROGESTERone (PROVERA) 5 MG tablet Take one tablet a day for 5 days in January, 2022 and every other month until you go 6 months bleeding. 15 tablet 0  No current facility-administered medications for this visit.    Family History  Problem Relation Age of Onset  . Breast cancer Mother 14       BRCA +  . BRCA 1/2 Mother        BRCA1 Pos  . Breast cancer Maternal Aunt 60       BRCA +  . BRCA 1/2 Maternal Aunt        BRCA1 pos  . Cancer - Colon Maternal Grandfather        colon cancer, BRCA +  . BRCA 1/2 Maternal Grandfather        BRCA1 pos  . Heart disease Father   . Hypertension Father   . Breast cancer Cousin 25       maternal, BRCA -  . BRCA 1/2 Cousin        BRCA Neg  . Ovarian cancer Cousin 25       dec  . Breast cancer Maternal Aunt        BRCA +  . BRCA 1/2 Maternal Aunt        BRCA1 pos  . Breast cancer Maternal Aunt        BRCA +  . BRCA 1/2 Maternal Aunt        BRCA1 pos  . Breast cancer Sister        BRCA -  . BRCA 1/2 Sister        BRCA NEG  . BRCA 1/2 Brother         BRCA1 pos  . Heart disease Paternal Grandfather   . BRCA 1/2 Sister        BRCA Pos    Review of Systems  All other systems reviewed and are negative.   Exam:   BP 134/84   Pulse 68   Ht 5' 9.5" (1.765 m)   Wt 167 lb (75.8 kg)   SpO2 98%   BMI 24.31 kg/m   Weight change: '@WEIGHTCHANGE' @ Height:   Height: 5' 9.5" (176.5 cm)  Ht Readings from Last 3 Encounters:  10/10/20 5' 9.5" (1.765 m)  07/25/20 '5\' 9"'  (1.753 m)  05/01/20 '5\' 9"'  (1.753 m)    General appearance: alert, cooperative and appears stated age Head: Normocephalic, without obvious abnormality, atraumatic Neck: no adenopathy, supple, symmetrical, trachea midline and thyroid normal to inspection and palpation Lungs: clear to auscultation bilaterally Cardiovascular: regular rate and rhythm Breasts: normal appearance, no masses or tenderness Abdomen: soft, non-tender; non distended,  no masses,  no organomegaly Extremities: extremities normal, atraumatic, no cyanosis or edema Skin: Skin color, texture, turgor normal. No rashes or lesions Lymph nodes: Cervical, supraclavicular, and axillary nodes normal. No abnormal inguinal nodes palpated Neurologic: Grossly normal   Pelvic: External genitalia:  no lesions              Urethra:  normal appearing urethra with no masses, tenderness or lesions              Bartholins and Skenes: normal                 Vagina: normal appearing vagina with normal color and discharge, no lesions              Cervix: no lesions               Bimanual Exam:  Uterus:  normal size, contour, position, consistency, mobility, non-tender              Adnexa: no mass, fullness,  tenderness               Rectovaginal: Confirms               Anus:  normal sphincter tone, no lesions  Gae Dry chaperoned for the exam.  A:  Well Woman with normal exam  IUD fell out in 10/21  H/O menorrhagia in 2019, Recent FSH was 112  Peri vs postmenopausal  P:   No pap this year  Labs with  primary  Discussed breast self exam  Discussed calcium and vit D intake  Mammogram and colonoscopy UTD  Provera in 1/22, call with or without w/d

## 2020-10-10 ENCOUNTER — Encounter: Payer: Self-pay | Admitting: Obstetrics and Gynecology

## 2020-10-10 ENCOUNTER — Ambulatory Visit (INDEPENDENT_AMBULATORY_CARE_PROVIDER_SITE_OTHER): Payer: BC Managed Care – PPO | Admitting: Obstetrics and Gynecology

## 2020-10-10 ENCOUNTER — Other Ambulatory Visit: Payer: Self-pay

## 2020-10-10 VITALS — BP 134/84 | HR 68 | Ht 69.5 in | Wt 167.0 lb

## 2020-10-10 DIAGNOSIS — Z01419 Encounter for gynecological examination (general) (routine) without abnormal findings: Secondary | ICD-10-CM | POA: Diagnosis not present

## 2020-10-10 MED ORDER — MEDROXYPROGESTERONE ACETATE 5 MG PO TABS: ORAL_TABLET | ORAL | 0 refills | Status: DC

## 2020-10-10 NOTE — Patient Instructions (Signed)

## 2020-10-11 ENCOUNTER — Other Ambulatory Visit: Payer: BC Managed Care – PPO | Admitting: *Deleted

## 2020-10-11 DIAGNOSIS — E7800 Pure hypercholesterolemia, unspecified: Secondary | ICD-10-CM

## 2020-10-11 LAB — HEPATIC FUNCTION PANEL
ALT: 51 IU/L — ABNORMAL HIGH (ref 0–32)
AST: 40 IU/L (ref 0–40)
Albumin: 4.6 g/dL (ref 3.8–4.9)
Alkaline Phosphatase: 53 IU/L (ref 44–121)
Bilirubin Total: 0.5 mg/dL (ref 0.0–1.2)
Bilirubin, Direct: 0.17 mg/dL (ref 0.00–0.40)
Total Protein: 6.5 g/dL (ref 6.0–8.5)

## 2020-10-11 LAB — LIPID PANEL
Chol/HDL Ratio: 2 ratio (ref 0.0–4.4)
Cholesterol, Total: 159 mg/dL (ref 100–199)
HDL: 80 mg/dL (ref >39)
LDL Chol Calc (NIH): 66 mg/dL (ref 0–99)
Triglycerides: 68 mg/dL (ref 0–149)
VLDL Cholesterol Cal: 13 mg/dL (ref 5–40)

## 2020-10-11 LAB — BASIC METABOLIC PANEL
BUN/Creatinine Ratio: 14 (ref 9–23)
BUN: 16 mg/dL (ref 6–24)
CO2: 22 mmol/L (ref 20–29)
Calcium: 10 mg/dL (ref 8.7–10.2)
Chloride: 102 mmol/L (ref 96–106)
Creatinine, Ser: 1.17 mg/dL — ABNORMAL HIGH (ref 0.57–1.00)
GFR calc Af Amer: 59 mL/min/{1.73_m2} — ABNORMAL LOW (ref >59)
GFR calc non Af Amer: 51 mL/min/{1.73_m2} — ABNORMAL LOW (ref >59)
Glucose: 99 mg/dL (ref 65–99)
Potassium: 4.2 mmol/L (ref 3.5–5.2)
Sodium: 141 mmol/L (ref 134–144)

## 2020-10-13 ENCOUNTER — Encounter: Payer: Self-pay | Admitting: Cardiovascular Disease

## 2020-10-13 NOTE — Progress Notes (Signed)
Cardiology Office Note:    Date:  10/14/2020   ID:  Mary Porter, DOB May 01, 1961, MRN [id number]  PCP:  Harlan Stains, MD  Cardiologist:  Eliott Amparan  Electrophysiologist:  None   Referring MD: Harlan Stains, MD   Chief Complaint  Patient presents with  . Hyperlipidemia    02/15/20    Mary Porter is a 59 y.o. female with a hx of hyperlipidemia.  She recently has developed some palpitations with exercise.  We are asked to see her today by Dr. Dema Severin for further evaluation of these palpitations.  Labs from Dr. Dema Severin were reviewed.  She had a NMR lipid panel performed this week.  Her LDL particle number is 1470 .  The total cholesterol is 210.  The LDL 130.  Her HDL is 70.  Triglyceride levels 49. Vitamin D level is 23.3.  Friend of Bea Laura.  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   Has noticed that her heart is beating hard after several fast sets  Swam [place] several years ago.    Has had episodes of tachycardia while standing in the kitchen .   Resolves with deep breaths .  Has noticed some tingling in her legs after swimming hard.  She has noticed some shortness of breath compared to how she felt last year.   Has been working out with a Physiological scientist .   gets winded when doing suicides with the trainer .     April 23, 2020: Mary Porter is seen today for follow-up visit.  She has a history of hyperlipidemia and has had some palpitations and shortness of breath with exercise.  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  Echocardiogram from February 29, 2020 reveals normal left ventricular systolic function.  She has grade 1 diastolic dysfunction.  She has no so that is why we ordered a valvular abnormalities.  Coronary calcium score of 178. This was 95th percentile for age and sex matched control. Calcium noted in the LAD and RCA.  Given her relatively high coronary calcium score we changed her atorvastatin to rosuvastatin.  We have scheduled  her for an exercise Myoview study. We discussed the fact that endurance athletes tend to have higher coronary calcium scores.  She is able to exercise at a very high level without any angina.  She does get short of breath if she does multiple sets of sprints but she thinks that this is probably fairly normal for someone her age.  Sept, 23, 2021;  Mary Porter is seen toda for follow  Up of her hyperlipidemia, elevated coronary calcium score. She is a long Wellsite geologist. Stress myoview showed excellent exercise capacity with no ischemia ,  Walked for 13 minutes.   Troubled by having to wear a mask during the treadmill.  Her LDL remains mildl elevated  .  103,  She never picked up her zetia.  Lipids look the same as her previous levels  Her goal is <70  Dec. 13, 2021: Mary Porter is seen for follow up of her HLD and elevated coronary calcium score GXT was negative for ischemia Still swimming  LFTs are higher in the rosuvastatin  20 mg daily , Also now on zetia 10 mg a day  She drinks 1-2 glasses of wine at night .  No myalgias.    Past Medical History:  Diagnosis Date  . Acne rosacea   . BRCA1 negative 03/18/09   [facility] -  mother, MGM, MA, MGA X 2, Mcousin  . Cystocele   . Exercise-induced asthma   . Family history of BRCA1 gene positive   . Family history of breast cancer   . Family history of breast cancer in female   . Family history of ovarian cancer   . History of 2019 novel coronavirus disease (COVID-19)   . History of COVID-19   . Hypercholesterolemia   . Hyperlipidemia   . Hypertension   . Kidney stone 20's  . Nephrolithiasis   . Osteopenia   . Palpitations   . PMDD (premenstrual dysphoric disorder)   . PMS (premenstrual syndrome)   . Prediabetes   . Rectocele   . Torn meniscus    right knee  . Vitamin D deficiency     Past Surgical History:  Procedure Laterality Date  . COLONOSCOPY    . INTRAUTERINE DEVICE (IUD) INSERTION  01/31/2018   Mirena   .  VAGINAL DELIVERY     x3    Current Medications: Current Meds  Medication Sig  . Cholecalciferol (VITAMIN D3) 50 MCG (2000 UT) capsule Take 2,000 Units by mouth daily.  Marland Kitchen doxycycline (DORYX) 100 MG EC tablet Take 100 mg by mouth daily.  Marland Kitchen ezetimibe (ZETIA) 10 MG tablet Take 1 tablet (10 mg total) by mouth daily.  . medroxyPROGESTERone (PROVERA) 5 MG tablet Take one tablet a day for 5 days in January, 2022 and every other month until you go 6 months bleeding.  . rosuvastatin (CRESTOR) 20 MG tablet Take 1 tablet (20 mg total) by mouth daily.  . valsartan (DIOVAN) 160 MG tablet Take 160 mg by mouth daily.     Allergies:   Patient has no known allergies.   Social History   Socioeconomic History  . Marital status: Married    Spouse name: Not on file  . Number of children: Not on file  . Years of education: Not on file  . Highest education level: Not on file  Occupational History  . Not on file  Tobacco Use  . Smoking status: Never Smoker  . Smokeless tobacco: Never Used  Vaping Use  . Vaping Use: Never used  Substance and Sexual Activity  . Alcohol use: Yes    Alcohol/week: 5.0 standard drinks    Types: 5 Glasses of wine per week  . Drug use: No  . Sexual activity: Yes    Birth control/protection: Surgical, I.U.D.    Comment: vasectomy  Other Topics Concern  . Not on file  Social History Narrative  . Not on file   Social Determinants of Health   Financial Resource Strain: Not on file  Food Insecurity: Not on file  Transportation Needs: Not on file  Physical Activity: Not on file  Stress: Not on file  Social Connections: Not on file     Family History: The patient's family history includes BRCA 1/2 in her brother, cousin, maternal aunt, maternal aunt, maternal aunt, maternal grandfather, mother, sister, and sister; Breast cancer in her maternal aunt, maternal aunt, and sister; Breast cancer (age of onset: 25) in her cousin; Breast cancer (age of onset: 67) in her  maternal aunt; Breast cancer (age of onset: 2) in her mother; Cancer - Colon in her maternal grandfather; Heart disease in her father and paternal grandfather; Hypertension in her father; Ovarian cancer (age of onset: 56) in her cousin.  Father had cornonary stents  ROS:   Please see the history of present illness.     All other systems  reviewed and are negative.  EKGs/Labs/Other Studies Reviewed:    The following studies were reviewed today:   EKG:         Recent Labs: 05/10/2020: TSH 1.940 10/11/2020: ALT 51; BUN 16; Creatinine, Ser 1.17; Potassium 4.2; Sodium 141  Recent Lipid Panel    Component Value Date/Time   CHOL 159 10/11/2020 0731   TRIG 68 10/11/2020 0731   HDL 80 10/11/2020 0731   CHOLHDL 2.0 10/11/2020 0731   LDLCALC 66 10/11/2020 0731    Physical Exam:     Physical Exam: Blood pressure 128/82, pulse 68, height _0  (1.753 m), weight 167 lb (75.8 kg), SpO2 99 %.  GEN:  Well nourished, well developed in no acute distress HEENT: Normal NECK: No JVD; No carotid bruits LYMPHATICS: No lymphadenopathy CARDIAC: RRR  RESPIRATORY:  Clear to auscultation without rales, wheezing or rhonchi  ABDOMEN: Soft, non-tender, non-distended MUSCULOSKELETAL:  No edema; No deformity  SKIN: Warm and dry NEUROLOGIC:  Alert and oriented x 3     ASSESSMENT:    1. Hypercholesterolemia   2. Coronary artery calcification    PLAN:    In order of problems listed above:  1.  Coronary artery calcifications:    No angina ,  Swims 3500- 4500 yards 3 times a week without angina   2.  Hyperlipidemia:    He is currently on rosuvastatin 20 mg a day and Zetia 10 mg a day. Last LDL is 66. Unfortunate her liver enzymes have increased slightly. We will recheck her liver enzymes in 1 month. If her liver enzymes have increased then will consider referral to the lipid clinic for PCSK9 therapy.  She will continue to work on her diet. Continue to exercise.    Medication Adjustments/Labs  and Tests Ordered: Current medicines are reviewed at length with the patient today.  Concerns regarding medicines are outlined above.  Orders Placed This Encounter  Procedures  . Hepatic function panel   No orders of the defined types were placed in this encounter.   Patient Instructions  Medication Instructions:  Your physician recommends that you continue on your current medications as directed. Please refer to the Current Medication list given to you today.  *If you need a refill on your cardiac medications before your next appointment, please call your pharmacy*   Lab Work: Your physician recommends that you return for lab work in 1 month on January 13 You may come in anytime that day between 7:30 am and 4:30 pm You will need to FAST for this appointment - nothing to eat or drink after midnight the night before except water.   If you have labs (blood work) drawn today and your tests are completely normal, you will receive your results only by: Marland Kitchen MyChart Message (if you have MyChart) OR . A paper copy in the mail If you have any lab test that is abnormal or we need to change your treatment, we will call you to review the results.   Testing/Procedures: None Ordered   Follow-Up: At [facility], you and your health needs are our priority.  As part of our continuing mission to provide you with exceptional heart care, we have created designated Provider Care Teams.  These Care Teams include your primary Cardiologist (physician) and Advanced Practice Providers (APPs -  Physician Assistants and Nurse Practitioners) who all work together to provide you with the care you need, when you need it.   Your next appointment:   6 month(s) on Monday June  13 at 8:00 am  The format for your next appointment:   In Person  Provider:   Mertie Moores, MD       Signed, Mertie Moores, MD  10/14/2020 2:10 PM    [place]

## 2020-10-14 ENCOUNTER — Encounter: Payer: Self-pay | Admitting: Cardiovascular Disease

## 2020-10-14 ENCOUNTER — Other Ambulatory Visit: Payer: Self-pay

## 2020-10-14 ENCOUNTER — Ambulatory Visit (INDEPENDENT_AMBULATORY_CARE_PROVIDER_SITE_OTHER): Payer: BC Managed Care – PPO | Admitting: Cardiovascular Disease

## 2020-10-14 VITALS — BP 128/82 | HR 68 | Ht 69.0 in | Wt 167.0 lb

## 2020-10-14 DIAGNOSIS — I2584 Coronary atherosclerosis due to calcified coronary lesion: Secondary | ICD-10-CM | POA: Diagnosis not present

## 2020-10-14 DIAGNOSIS — E7800 Pure hypercholesterolemia, unspecified: Secondary | ICD-10-CM | POA: Diagnosis not present

## 2020-10-14 DIAGNOSIS — I2510 Atherosclerotic heart disease of native coronary artery without angina pectoris: Secondary | ICD-10-CM

## 2020-10-14 NOTE — Patient Instructions (Addendum)
Medication Instructions:  Your physician recommends that you continue on your current medications as directed. Please refer to the Current Medication list given to you today.  *If you need a refill on your cardiac medications before your next appointment, please call your pharmacy*   Lab Work: Your physician recommends that you return for lab work in 1 month on January 13 You may come in anytime that day between 7:30 am and 4:30 pm You will need to FAST for this appointment - nothing to eat or drink after midnight the night before except water.   If you have labs (blood work) drawn today and your tests are completely normal, you will receive your results only by: Marland Kitchen MyChart Message (if you have MyChart) OR . A paper copy in the mail If you have any lab test that is abnormal or we need to change your treatment, we will call you to review the results.   Testing/Procedures: None Ordered   Follow-Up: At [facility], you and your health needs are our priority.  As part of our continuing mission to provide you with exceptional heart care, we have created designated Provider Care Teams.  These Care Teams include your primary Cardiologist (physician) and Advanced Practice Providers (APPs -  Physician Assistants and Nurse Practitioners) who all work together to provide you with the care you need, when you need it.   Your next appointment:   6 month(s) on Monday June 13 at 8:00 am  The format for your next appointment:   In Person  Provider:   Kristeen Miss, MD

## 2020-11-14 ENCOUNTER — Other Ambulatory Visit: Payer: BC Managed Care – PPO

## 2020-11-19 DIAGNOSIS — Z1152 Encounter for screening for COVID-19: Secondary | ICD-10-CM | POA: Diagnosis not present

## 2020-12-19 DIAGNOSIS — R7303 Prediabetes: Secondary | ICD-10-CM | POA: Diagnosis not present

## 2020-12-19 DIAGNOSIS — E559 Vitamin D deficiency, unspecified: Secondary | ICD-10-CM | POA: Diagnosis not present

## 2020-12-19 DIAGNOSIS — E785 Hyperlipidemia, unspecified: Secondary | ICD-10-CM | POA: Diagnosis not present

## 2020-12-19 DIAGNOSIS — I10 Essential (primary) hypertension: Secondary | ICD-10-CM | POA: Diagnosis not present

## 2021-04-14 ENCOUNTER — Ambulatory Visit: Payer: BC Managed Care – PPO | Admitting: Cardiovascular Disease

## 2021-06-03 NOTE — Progress Notes (Signed)
Cardiology Office Note:    Date:  06/04/2021   ID:  Mary Porter, DOB 05/13/1961, MRN [id number]  PCP:  Harlan Stains, MD  Cardiologist:  [place]  Electrophysiologist:  None   Referring MD: Harlan Stains, MD   Chief Complaint  Patient presents with   Hyperlipidemia     02/15/20    Mary Porter is a 60 y.o. female with a hx of hyperlipidemia.  She recently has developed some palpitations with exercise.  We are asked to see her today by Dr. Dema Severin for further evaluation of these palpitations.  Labs from Dr. Dema Severin were reviewed.  She had a NMR lipid panel performed this week.  Her LDL particle number is 1470 .  The total cholesterol is 210.  The LDL 130.  Her HDL is 70.  Triglyceride levels 49. Vitamin D level is 23.3.  Friend of Bea Laura.  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   Has noticed that her heart is beating hard after several fast sets  Swam [place] several years ago.    Has had episodes of tachycardia while standing in the kitchen .   Resolves with deep breaths .  Has noticed some tingling in her legs after swimming hard.  She has noticed some shortness of breath compared to how she felt last year.   Has been working out with a Physiological scientist .   gets winded when doing suicides with the trainer .     April 23, 2020: Mary Porter is seen today for follow-up visit.  She has a history of hyperlipidemia and has had some palpitations and shortness of breath with exercise.  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  Echocardiogram from February 29, 2020 reveals normal left ventricular systolic function.  She has grade 1 diastolic dysfunction.  She has no so that is why we ordered a valvular abnormalities.  Coronary calcium score of 178. This was 95th percentile for age and sex matched control. Calcium noted in the LAD and RCA.  Given her relatively high coronary calcium score we changed her atorvastatin to rosuvastatin.  We have scheduled  her for an exercise Myoview study. We discussed the fact that endurance athletes tend to have higher coronary calcium scores.  She is able to exercise at a very high level without any angina.  She does get short of breath if she does multiple sets of sprints but she thinks that this is probably fairly normal for someone her age.  Sept, 23, 2021;  Mary Porter is seen toda for follow  Up of her hyperlipidemia, elevated coronary calcium score. She is a long Wellsite geologist. Stress myoview showed excellent exercise capacity with no ischemia ,  Walked for 13 minutes.   Troubled by having to wear a mask during the treadmill.  Her LDL remains mildl elevated  .  103,  She never picked up her zetia.  Lipids look the same as her previous levels  Her goal is <70  Dec. 13, 2021: Mary Porter is seen for follow up of her HLD and elevated coronary calcium score GXT was negative for ischemia Still swimming  LFTs are higher in the rosuvastatin  20 mg daily , Also now on zetia 10 mg a day  She drinks 1-2 glasses of wine at night .  No myalgias.  Aug. 3, 2022: Mary Porter is seen today for follow up of her HLD and coronary artery callcifications Father had cabg in his 75s  Has occasional palpitations  Last only for a few seconds. Typically does a valsalva with resolution  Does not occur with exercise  Has been trying to drink while swimming    Past Medical History:  Diagnosis Date   Acne rosacea    BRCA1 negative 03/18/09   [place] of Breast Cancer - mother, MGM, MA, MGA X 2, Mcousin   Cystocele    Exercise-induced asthma    Family history of BRCA1 gene positive    Family history of breast cancer    Family history of breast cancer in female    Family history of ovarian cancer    History of 2019 novel coronavirus disease (COVID-19)    History of COVID-19    Hypercholesterolemia    Hyperlipidemia    Hypertension    Kidney stone 20's   Nephrolithiasis    Osteopenia    Palpitations    PMDD  (premenstrual dysphoric disorder)    PMS (premenstrual syndrome)    Prediabetes    Rectocele    Torn meniscus    right knee   Vitamin D deficiency     Past Surgical History:  Procedure Laterality Date   COLONOSCOPY     INTRAUTERINE DEVICE (IUD) INSERTION  01/31/2018   Mirena    VAGINAL DELIVERY     x3    Current Medications: Current Meds  Medication Sig   Cholecalciferol (VITAMIN D3) 50 MCG (2000 UT) capsule Take 2,000 Units by mouth daily.   doxycycline (DORYX) 100 MG EC tablet Take 100 mg by mouth daily.   ezetimibe (ZETIA) 10 MG tablet Take 1 tablet (10 mg total) by mouth daily.   medroxyPROGESTERone (PROVERA) 5 MG tablet Take one tablet a day for 5 days in January, 2022 and every other month until you go 6 months bleeding.   rosuvastatin (CRESTOR) 20 MG tablet Take 1 tablet (20 mg total) by mouth daily.   valsartan (DIOVAN) 160 MG tablet Take 160 mg by mouth daily.     Allergies:   Patient has no known allergies.   Social History   Socioeconomic History   Marital status: Married    Spouse name: Not on file   Number of children: Not on file   Years of education: Not on file   Highest education level: Not on file  Occupational History   Not on file  Tobacco Use   Smoking status: Never   Smokeless tobacco: Never  Vaping Use   Vaping Use: Never used  Substance and Sexual Activity   Alcohol use: Yes    Alcohol/week: 5.0 standard drinks    Types: 5 Glasses of wine per week   Drug use: No   Sexual activity: Yes    Birth control/protection: Surgical, I.U.D.    Comment: vasectomy  Other Topics Concern   Not on file  Social History Narrative   Not on file   Social Determinants of Health   Financial Resource Strain: Not on file  Food Insecurity: Not on file  Transportation Needs: Not on file  Physical Activity: Not on file  Stress: Not on file  Social Connections: Not on file     Family History: The patient's family history includes BRCA 1/2 in her  brother, cousin, maternal aunt, maternal aunt, maternal aunt, maternal grandfather, mother, sister, and sister; Breast cancer in her maternal aunt, maternal aunt, and sister; Breast cancer (age of onset: 61) in her cousin; Breast cancer (age of onset: 4) in her maternal aunt; Breast cancer (age of onset: 55)  in her mother; Cancer - Colon in her maternal grandfather; Heart disease in her father and paternal grandfather; Hypertension in her father; Ovarian cancer (age of onset: 74) in her cousin.  Father had cornonary stents  ROS:   Please see the history of present illness.     All other systems reviewed and are negative.  EKGs/Labs/Other Studies Reviewed:    The following studies were reviewed today:   EKG:      Aug. 3, 2022:   Recent Labs: 10/11/2020: ALT 51; BUN 16; Creatinine, Ser 1.17; Potassium 4.2; Sodium 141  Recent Lipid Panel    Component Value Date/Time   CHOL 159 10/11/2020 0731   TRIG 68 10/11/2020 0731   HDL 80 10/11/2020 0731   CHOLHDL 2.0 10/11/2020 0731   LDLCALC 66 10/11/2020 0731    Physical Exam:    Physical Exam: Blood pressure 128/78, pulse (!) 57, height _0  (1.753 m), weight 171 lb 6.4 oz (77.7 kg), SpO2 98 %.  GEN:  Well nourished, well developed in no acute distress HEENT: Normal NECK: No JVD; No carotid bruits LYMPHATICS: No lymphadenopathy CARDIAC: RRR , no murmurs, rubs, gallops RESPIRATORY:  Clear to auscultation without rales, wheezing or rhonchi  ABDOMEN: Soft, non-tender, non-distended MUSCULOSKELETAL:  No edema; No deformity  SKIN: Warm and dry NEUROLOGIC:  Alert and oriented x 3     ASSESSMENT:    1. Coronary artery calcification   2. Family history of early CAD   3. Mixed hyperlipidemia   4. Palpitations     PLAN:      1.  Coronary artery calcifications:     stable, no angina   2.  Hyperlipidemia:    Check lipids, BMP , ALT next week.     3.   Elevated glucose:   concerned about occasional elevated glucose levels .   Will try to limit her carbs     Medication Adjustments/Labs and Tests Ordered: Current medicines are reviewed at length with the patient today.  Concerns regarding medicines are outlined above.  Orders Placed This Encounter  Procedures   Lipid Profile   Basic Metabolic Panel (BMET)   ALT   EKG 12-Lead    No orders of the defined types were placed in this encounter.   Patient Instructions  Medication Instructions:  Your physician recommends that you continue on your current medications as directed. Please refer to the Current Medication list given to you today.  *If you need a refill on your cardiac medications before your next appointment, please call your pharmacy*   Lab Work: Your physician recommends that you return for lab work in: 1 week on Thursday Aug. 11 You may come in anytime on that date after 7:30 am You will need to FAST for this appointment - nothing to eat or drink after midnight the night before except water.  If you have labs (blood work) drawn today and your tests are completely normal, you will receive your results only by: [place] (if you have MyChart) OR A paper copy in the mail If you have any lab test that is abnormal or we need to change your treatment, we will call you to review the results.   Testing/Procedures: None Ordered   Follow-Up: At [facility], you and your health needs are our priority.  As part of our continuing mission to provide you with exceptional heart care, we have created designated Provider Care Teams.  These Care Teams include your primary Cardiologist (physician) and Advanced Practice Providers (  APPs -  Physician Assistants and Nurse Practitioners) who all work together to provide you with the care you need, when you need it.   Your next appointment:   1 year(s)  The format for your next appointment:   In Person  Provider:   You may see Mertie Moores, MD or one of the following Advanced Practice Providers on  your designated Care Team:   Richardson Dopp, PA-C Robbie Lis, [place]    Signed, Mertie Moores, MD  06/04/2021 8:35 AM    [place]

## 2021-06-04 ENCOUNTER — Encounter: Payer: Self-pay | Admitting: Cardiovascular Disease

## 2021-06-04 ENCOUNTER — Ambulatory Visit (INDEPENDENT_AMBULATORY_CARE_PROVIDER_SITE_OTHER): Payer: BC Managed Care – PPO | Admitting: Cardiovascular Disease

## 2021-06-04 ENCOUNTER — Other Ambulatory Visit: Payer: Self-pay

## 2021-06-04 VITALS — BP 128/78 | HR 57 | Ht 69.0 in | Wt 171.4 lb

## 2021-06-04 DIAGNOSIS — Z8249 Family history of ischemic heart disease and other diseases of the circulatory system: Secondary | ICD-10-CM

## 2021-06-04 DIAGNOSIS — R002 Palpitations: Secondary | ICD-10-CM

## 2021-06-04 DIAGNOSIS — E782 Mixed hyperlipidemia: Secondary | ICD-10-CM | POA: Diagnosis not present

## 2021-06-04 DIAGNOSIS — I2510 Atherosclerotic heart disease of native coronary artery without angina pectoris: Secondary | ICD-10-CM

## 2021-06-04 DIAGNOSIS — I2584 Coronary atherosclerosis due to calcified coronary lesion: Secondary | ICD-10-CM

## 2021-06-04 NOTE — Patient Instructions (Addendum)
Medication Instructions:  Your physician recommends that you continue on your current medications as directed. Please refer to the Current Medication list given to you today.  *If you need a refill on your cardiac medications before your next appointment, please call your pharmacy*   Lab Work: Your physician recommends that you return for lab work in: 1 week on Thursday Aug. 11 You may come in anytime on that date after 7:30 am You will need to FAST for this appointment - nothing to eat or drink after midnight the night before except water.  If you have labs (blood work) drawn today and your tests are completely normal, you will receive your results only by: MyChart Message (if you have MyChart) OR A paper copy in the mail If you have any lab test that is abnormal or we need to change your treatment, we will call you to review the results.   Testing/Procedures: None Ordered   Follow-Up: At [facility], you and your health needs are our priority.  As part of our continuing mission to provide you with exceptional heart care, we have created designated Provider Care Teams.  These Care Teams include your primary Cardiologist (physician) and Advanced Practice Providers (APPs -  Physician Assistants and Nurse Practitioners) who all work together to provide you with the care you need, when you need it.   Your next appointment:   1 year(s)  The format for your next appointment:   In Person  Provider:   You may see Kristeen Miss, MD or one of the following Advanced Practice Providers on your designated Care Team:   Tereso Newcomer, PA-C [place], [place]

## 2021-06-12 ENCOUNTER — Other Ambulatory Visit: Payer: BC Managed Care – PPO

## 2021-07-18 ENCOUNTER — Other Ambulatory Visit: Payer: Self-pay | Admitting: *Deleted

## 2021-07-18 MED ORDER — ROSUVASTATIN CALCIUM 20 MG PO TABS: 20.0000 mg | ORAL_TABLET | Freq: Every day | ORAL | 3 refills | Status: DC

## 2021-08-12 ENCOUNTER — Other Ambulatory Visit: Payer: Self-pay | Admitting: Family Medicine

## 2021-08-12 DIAGNOSIS — Z1231 Encounter for screening mammogram for malignant neoplasm of breast: Secondary | ICD-10-CM

## 2021-09-04 ENCOUNTER — Other Ambulatory Visit: Payer: Self-pay | Admitting: Neurology

## 2021-09-04 DIAGNOSIS — Z0000 Encounter for general adult medical examination without abnormal findings: Secondary | ICD-10-CM | POA: Diagnosis not present

## 2021-09-04 DIAGNOSIS — E559 Vitamin D deficiency, unspecified: Secondary | ICD-10-CM | POA: Diagnosis not present

## 2021-09-04 DIAGNOSIS — E785 Hyperlipidemia, unspecified: Secondary | ICD-10-CM | POA: Diagnosis not present

## 2021-09-04 DIAGNOSIS — Z23 Encounter for immunization: Secondary | ICD-10-CM | POA: Diagnosis not present

## 2021-09-04 DIAGNOSIS — R7303 Prediabetes: Secondary | ICD-10-CM | POA: Diagnosis not present

## 2021-09-04 DIAGNOSIS — I10 Essential (primary) hypertension: Secondary | ICD-10-CM | POA: Diagnosis not present

## 2021-09-04 MED ORDER — EZETIMIBE 10 MG PO TABS: 10.0000 mg | ORAL_TABLET | Freq: Every day | ORAL | 3 refills | Status: DC

## 2021-09-10 ENCOUNTER — Ambulatory Visit
Admission: RE | Admit: 2021-09-10 | Discharge: 2021-09-10 | Disposition: A | Discharge status: Home / Self Care | Payer: BC Managed Care – PPO | Source: Ambulatory Visit | Attending: Family Medicine | Admitting: Family Medicine

## 2021-09-10 ENCOUNTER — Other Ambulatory Visit: Payer: Self-pay

## 2021-09-10 DIAGNOSIS — Z1231 Encounter for screening mammogram for malignant neoplasm of breast: Secondary | ICD-10-CM

## 2021-09-11 ENCOUNTER — Other Ambulatory Visit: Payer: Self-pay | Admitting: Family Medicine

## 2021-09-11 DIAGNOSIS — R928 Other abnormal and inconclusive findings on diagnostic imaging of breast: Secondary | ICD-10-CM

## 2021-10-10 ENCOUNTER — Ambulatory Visit
Admission: RE | Admit: 2021-10-10 | Discharge: 2021-10-10 | Disposition: A | Discharge status: Home / Self Care | Payer: BC Managed Care – PPO | Source: Ambulatory Visit | Attending: Family Medicine | Admitting: Family Medicine

## 2021-10-10 DIAGNOSIS — R928 Other abnormal and inconclusive findings on diagnostic imaging of breast: Secondary | ICD-10-CM

## 2021-10-10 DIAGNOSIS — R922 Inconclusive mammogram: Secondary | ICD-10-CM | POA: Diagnosis not present

## 2021-11-04 NOTE — Progress Notes (Signed)
61 y.o. [id number] Married White or Caucasian Not Hispanic or Latino female here for annual exam. No vaginal bleeding.    H/O rectocele, not really bothersome. Some urgency to have a BM. She has a BM every 1-2 days. No trouble emptying her bowels.   Occasional urinary incontinence, not really bothersome, she does kegels.   No dyspareunia.   FH breast cancer, she had negative BRCA testing. She has a sister and cousin with breast cancer who were BRCA negative (both were in their late 54's). Genetics calculated her risk at 12.1%  No LMP recorded. Patient is perimenopausal.          Sexually active: Yes.    The current method of family planning is post menopausal status.    Exercising: Yes.     Swimming   yoga  Smoker:  no  Health Maintenance: Pap:   09/20/2018 WNL NEG HPV,  04/30/15 negative with neg HR HPV  History of abnormal Pap:  no MMG:  111/9/22 further eval needed on the left, Left f/u 12/11/20 Bi-rads 2 benign ; f/u in 1 year.  BMD:    04/10/16, -0.2 Spine / -0.5 Left Femur Neck Colonoscopy: 05/10/12, normal, repeat in 10 years TDaP:  03/2014  Gardasil: n/a   reports that she has never smoked. She has never used smokeless tobacco. She reports current alcohol use of about 5.0 standard drinks per week. She reports that she does not use drugs. Drinking 2 drinks a week. She is a Forensic psychologist. 3 kids, one grandson (2), one on the way.   Past Medical History:  Diagnosis Date   Acne rosacea    BRCA1 negative 03/18/09   [place] of Breast Cancer - mother, MGM, MA, MGA X 2, Mcousin   Cystocele    Exercise-induced asthma    Family history of BRCA1 gene positive    Family history of breast cancer    Family history of breast cancer in female    Family history of ovarian cancer    History of 2019 novel coronavirus disease (COVID-19)    History of COVID-19    Hypercholesterolemia    Hyperlipidemia    Hypertension    Kidney stone 20's   Nephrolithiasis    Osteopenia    Palpitations    PMDD  (premenstrual dysphoric disorder)    PMS (premenstrual syndrome)    Prediabetes    Rectocele    Torn meniscus    right knee   Vitamin D deficiency     Past Surgical History:  Procedure Laterality Date   COLONOSCOPY     INTRAUTERINE DEVICE (IUD) INSERTION  01/31/2018   Mirena    VAGINAL DELIVERY     x3    Current Outpatient Medications  Medication Sig Dispense Refill   Cholecalciferol (VITAMIN D3) 50 MCG (2000 UT) capsule Take 2,000 Units by mouth daily.     doxycycline (DORYX) 100 MG EC tablet Take 100 mg by mouth daily.     ezetimibe (ZETIA) 10 MG tablet Take 1 tablet (10 mg total) by mouth daily. 90 tablet 3   rosuvastatin (CRESTOR) 20 MG tablet Take 1 tablet (20 mg total) by mouth daily. 90 tablet 3   valsartan (DIOVAN) 160 MG tablet Take 160 mg by mouth daily.  5   No current facility-administered medications for this visit.    Family History  Problem Relation Age of Onset   Breast cancer Mother 73       BRCA +   BRCA 1/2 Mother  BRCA1 Pos   Breast cancer Maternal Aunt 47       BRCA +   BRCA 1/2 Maternal Aunt        BRCA1 pos   Cancer - Colon Maternal Grandfather        colon cancer, BRCA +   BRCA 1/2 Maternal Grandfather        BRCA1 pos   Heart disease Father    Hypertension Father    Breast cancer Cousin 25       maternal, BRCA -   BRCA 1/2 Cousin        BRCA Neg   Ovarian cancer Cousin 25       dec   Breast cancer Maternal Aunt        BRCA +   BRCA 1/2 Maternal Aunt        BRCA1 pos   Breast cancer Maternal Aunt        BRCA +   BRCA 1/2 Maternal Aunt        BRCA1 pos   Breast cancer Sister        BRCA -   BRCA 1/2 Sister        BRCA NEG   BRCA 1/2 Brother        BRCA1 pos   Heart disease Paternal Grandfather    BRCA 1/2 Sister        BRCA Pos    Review of Systems  All other systems reviewed and are negative.  Exam:   BP 110/70    Pulse 67    Ht '5\' 9"'  (1.753 m)    SpO2 100%    BMI 25.31 kg/m   Weight change: '@WEIGHTCHANGE' @  Height:   Height: '5\' 9"'  (175.3 cm)  Ht Readings from Last 3 Encounters:  11/05/21 '5\' 9"'  (1.753 m)  06/04/21 '5\' 9"'  (1.753 m)  10/14/20 '5\' 9"'  (1.753 m)    General appearance: alert, cooperative and appears stated age Head: Normocephalic, without obvious abnormality, atraumatic Neck: no adenopathy, supple, symmetrical, trachea midline and thyroid normal to inspection and palpation Lungs: clear to auscultation bilaterally Cardiovascular: regular rate and rhythm Breasts: normal appearance, no masses or tenderness Abdomen: soft, non-tender; non distended,  no masses,  no organomegaly Extremities: extremities normal, atraumatic, no cyanosis or edema Skin: Skin color, texture, turgor normal. No rashes or lesions Lymph nodes: Cervical, supraclavicular, and axillary nodes normal. No abnormal inguinal nodes palpated Neurologic: Grossly normal   Pelvic: External genitalia:  no lesions              Urethra:  normal appearing urethra with no masses, tenderness or lesions              Bartholins and Skenes: normal                 Vagina: mildly atrophic appearing vagina with a small grade 2 rectocele and small grade 2 cystocele              Cervix: no lesions               Bimanual Exam:  Uterus:  normal size, contour, position, consistency, mobility, non-tender              Adnexa: no mass, fullness, tenderness               Rectovaginal: Confirms               Anus:  normal sphincter tone, no lesions  Gae Dry chaperoned for  the exam.  1. Well woman exam No pap this year Mammogram UTD Colonoscopy due this summer Labs with primary Discussed breast self exam Discussed calcium and vit D intake  2. Rectocele Not bothersome at this time Avoid constipation, heavy lifting and straining.   3. Midline cystocele Not bothersome

## 2021-11-05 ENCOUNTER — Ambulatory Visit (INDEPENDENT_AMBULATORY_CARE_PROVIDER_SITE_OTHER): Payer: BC Managed Care – PPO | Admitting: Obstetrics and Gynecology

## 2021-11-05 ENCOUNTER — Encounter: Payer: Self-pay | Admitting: Obstetrics and Gynecology

## 2021-11-05 ENCOUNTER — Other Ambulatory Visit: Payer: Self-pay

## 2021-11-05 VITALS — BP 110/70 | HR 67 | Ht 69.0 in

## 2021-11-05 DIAGNOSIS — K219 Gastro-esophageal reflux disease without esophagitis: Secondary | ICD-10-CM | POA: Insufficient documentation

## 2021-11-05 DIAGNOSIS — N816 Rectocele: Secondary | ICD-10-CM | POA: Diagnosis not present

## 2021-11-05 DIAGNOSIS — Z01419 Encounter for gynecological examination (general) (routine) without abnormal findings: Secondary | ICD-10-CM | POA: Diagnosis not present

## 2021-11-05 DIAGNOSIS — N8111 Cystocele, midline: Secondary | ICD-10-CM

## 2021-11-05 DIAGNOSIS — K625 Hemorrhage of anus and rectum: Secondary | ICD-10-CM | POA: Insufficient documentation

## 2021-11-05 NOTE — Patient Instructions (Signed)

## 2021-11-12 DIAGNOSIS — L859 Epidermal thickening, unspecified: Secondary | ICD-10-CM | POA: Diagnosis not present

## 2021-11-12 DIAGNOSIS — R238 Other skin changes: Secondary | ICD-10-CM | POA: Diagnosis not present

## 2021-11-12 DIAGNOSIS — L821 Other seborrheic keratosis: Secondary | ICD-10-CM | POA: Diagnosis not present

## 2021-11-12 DIAGNOSIS — L570 Actinic keratosis: Secondary | ICD-10-CM | POA: Diagnosis not present

## 2022-05-17 ENCOUNTER — Encounter: Payer: Self-pay | Admitting: Cardiovascular Disease

## 2022-05-17 NOTE — Progress Notes (Signed)
This encounter was created in error - please disregard.

## 2022-05-19 ENCOUNTER — Encounter: Payer: BC Managed Care – PPO | Admitting: Cardiovascular Disease

## 2022-05-19 DIAGNOSIS — R7303 Prediabetes: Secondary | ICD-10-CM | POA: Diagnosis not present

## 2022-05-19 DIAGNOSIS — E785 Hyperlipidemia, unspecified: Secondary | ICD-10-CM | POA: Diagnosis not present

## 2022-05-23 ENCOUNTER — Encounter: Payer: Self-pay | Admitting: Cardiovascular Disease

## 2022-05-23 NOTE — Progress Notes (Unsigned)
Cardiology Office Note:    Date:  05/25/2022   ID:  Mary Porter, DOB 1961/07/04, MRN [id number]  PCP:  Harlan Stains, MD  Cardiologist:  Bocephus Cali  Electrophysiologist:  None   Referring MD: Harlan Stains, MD   Chief Complaint  Patient presents with   Hyperlipidemia     02/15/20    Mary Porter is a 61 y.o. female with a hx of hyperlipidemia.  She recently has developed some palpitations with exercise.  We are asked to see her today by Dr. Dema Severin for further evaluation of these palpitations.  Labs from Dr. Dema Severin were reviewed.  She had a NMR lipid panel performed this week.  Her LDL particle number is 1470 .  The total cholesterol is 210.  The LDL 130.  Her HDL is 70.  Triglyceride levels 49. Vitamin D level is 23.3.  Friend of Mary Porter.  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   Has noticed that her heart is beating hard after several fast sets  Swam [place] several years ago.    Has had episodes of tachycardia while standing in the kitchen .   Resolves with deep breaths .  Has noticed some tingling in her legs after swimming hard.  She has noticed some shortness of breath compared to how she felt last year.   Has been working out with a Physiological scientist .   gets winded when doing suicides with the trainer .     April 23, 2020: Mary Porter is seen today for follow-up visit.  She has a history of hyperlipidemia and has had some palpitations and shortness of breath with exercise.  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  Echocardiogram from February 29, 2020 reveals normal left ventricular systolic function.  She has grade 1 diastolic dysfunction.  She has no so that is why we ordered a valvular abnormalities.  Coronary calcium score of 178. This was 95th percentile for age and sex matched control. Calcium noted in the LAD and RCA.  Given her relatively high coronary calcium score we changed her atorvastatin to rosuvastatin.  We have scheduled  her for an exercise Myoview study. We discussed the fact that endurance athletes tend to have higher coronary calcium scores.  She is able to exercise at a very high level without any angina.  She does get short of breath if she does multiple sets of sprints but she thinks that this is probably fairly normal for someone her age.  Sept, 23, 2021;  [name] is seen toda for follow  Up of her hyperlipidemia, elevated coronary calcium score. She is a long Wellsite geologist. Stress myoview showed excellent exercise capacity with no ischemia ,  Walked for 13 minutes.   Troubled by having to wear a mask during the treadmill.  Her LDL remains mildl elevated  .  103,  She never picked up her zetia.  Lipids look the same as her previous levels  Her goal is <70  Dec. 13, 2021: Mary Porter is seen for follow up of her HLD and elevated coronary calcium score GXT was negative for ischemia Still swimming  LFTs are higher in the rosuvastatin  20 mg daily , Also now on zetia 10 mg a day  She drinks 1-2 glasses of wine at night .  No myalgias.  Aug. 3, 2022: Mary Porter is seen today for follow up of her HLD and coronary artery callcifications Father had cabg in his 69s  Has occasional palpitations  Last only for a few seconds. Typically does a valsalva with resolution  Does not occur with exercise  Has been trying to drink while swimming   May 19, 2022 Appt cancelled  May 23, 2022 Mary Porter is seen for follow up of her HLD and coronary artery calcifications  Coronary calcium score of 178. This was 95th percentile for age and sex matched control. Calcium noted in the LAD and RCA. Occasional palpitations  Wt is 174 lbs   Has been cutting back on her wine and caffeine. We discussed her weight   Labs Glucose was 164.  No family hx of DM         Past Medical History:  Diagnosis Date   Acne rosacea    BRCA1 negative 03/18/2009   [place] of Breast Cancer - mother, MGM, MA, MGA X 2, Mcousin    Cystocele    Exercise-induced asthma    Family history of BRCA1 gene positive    Family history of breast cancer    Family history of breast cancer in female    Family history of ovarian cancer    History of 2019 novel coronavirus disease (COVID-19)    History of COVID-19    Hypercholesterolemia    Hyperlipidemia    Hypertension    Kidney stone 20's   Nephrolithiasis    Osteopenia    Palpitations    Prediabetes    Rectocele    Torn meniscus    right knee   Vitamin D deficiency     Past Surgical History:  Procedure Laterality Date   COLONOSCOPY     INTRAUTERINE DEVICE (IUD) INSERTION  01/31/2018   Mirena    VAGINAL DELIVERY     x3    Current Medications: Current Meds  Medication Sig   Cholecalciferol (VITAMIN D3) 50 MCG (2000 UT) capsule Take 2,000 Units by mouth daily.   doxycycline (VIBRAMYCIN) 100 MG capsule Take 100 mg by mouth daily.   ezetimibe (ZETIA) 10 MG tablet Take 1 tablet (10 mg total) by mouth daily.   rosuvastatin (CRESTOR) 20 MG tablet Take 1 tablet (20 mg total) by mouth daily.   valsartan (DIOVAN) 160 MG tablet Take 160 mg by mouth daily.     Allergies:   Patient has no known allergies.   Social History   Socioeconomic History   Marital status: Married    Spouse name: Not on file   Number of children: Not on file   Years of education: Not on file   Highest education level: Not on file  Occupational History   Not on file  Tobacco Use   Smoking status: Never   Smokeless tobacco: Never  Vaping Use   Vaping Use: Never used  Substance and Sexual Activity   Alcohol use: Yes    Alcohol/week: 5.0 standard drinks of alcohol    Types: 5 Glasses of wine per week   Drug use: No   Sexual activity: Yes    Birth control/protection: Surgical, I.U.D.    Comment: vasectomy  Other Topics Concern   Not on file  Social History Narrative   Not on file   Social Determinants of Health   Financial Resource Strain: Not on file  Food Insecurity: Not  on file  Transportation Needs: Not on file  Physical Activity: Not on file  Stress: Not on file  Social Connections: Not on file     Family History: The patient's family history includes BRCA 1/2 in her brother, cousin, maternal  aunt, maternal aunt, maternal aunt, maternal grandfather, mother, sister, and sister; Breast cancer in her maternal aunt, maternal aunt, and sister; Breast cancer (age of onset: 41) in her cousin; Breast cancer (age of onset: 35) in her maternal aunt; Breast cancer (age of onset: 15) in her mother; Cancer - Colon in her maternal grandfather; Heart disease in her father and paternal grandfather; Hypertension in her father; Ovarian cancer (age of onset: 52) in her cousin.  Father had cornonary stents  ROS:   Please see the history of present illness.     All other systems reviewed and are negative.  EKGs/Labs/Other Studies Reviewed:    The following studies were reviewed today:   EKG:      May 25, 2022 normal sinus rhythm at 84.  Normal EKG.   Recent Labs: No results found for requested labs within last 365 days.  Recent Lipid Panel    Component Value Date/Time   CHOL 159 10/11/2020 0731   TRIG 68 10/11/2020 0731   HDL 80 10/11/2020 0731   CHOLHDL 2.0 10/11/2020 0731   LDLCALC 66 10/11/2020 0731    Physical Exam:     Physical Exam: Blood pressure 124/80, pulse 84, height '5\' 9"'  (1.753 m), weight 170 lb 12.8 oz (77.5 kg), last menstrual period 01/31/2018, SpO2 95 %.  GEN:  Well nourished, well developed in no acute distress HEENT: Normal NECK: No JVD; No carotid bruits LYMPHATICS: No lymphadenopathy CARDIAC: RRR , no murmurs, rubs, gallops RESPIRATORY:  Clear to auscultation without rales, wheezing or rhonchi  ABDOMEN: Soft, non-tender, non-distended MUSCULOSKELETAL:  No edema; No deformity  SKIN: Warm and dry NEUROLOGIC:  Alert and oriented x 3      ASSESSMENT:    1. Mixed hyperlipidemia   2. Palpitations      PLAN:      1.   Coronary artery calcifications:     She had a coronary calcium score Coronary calcium score of 178. This was 95th percentile for age and sex matched control. Calcium noted in the LAD and RCA  Her last LDL in our system was 88 Continue current medications.  She is committed to losing 20 pounds before her next visit in 6 months.   2.  Hyperlipidemia:   Continue rosuvastatin and Zetia.  Her LDL goal is 50-7- Will refer her to the lipid clinic for consideration of a PCSK9 inhibitor.       3.   Elevated glucose:   Last glucose level was 164.  I encouraged her to continue with her weight loss efforts.  I have tried to stress the importance of getting her glucose under control and not necessarily avoiding diabetic medications.  I do not think that she needs Ozempic but she does need to get her glucose levels down.  We will recheck levels in 6 months.     Medication Adjustments/Labs and Tests Ordered: Current medicines are reviewed at length with the patient today.  Concerns regarding medicines are outlined above.  Orders Placed This Encounter  Procedures   ALT   Basic metabolic panel   Lipid panel   AMB Referral to [facility]   EKG 12-Lead    No orders of the defined types were placed in this encounter.    Patient Instructions  Medication Instructions:  Your physician recommends that you continue on your current medications as directed. Please refer to the Current Medication list given to you today.  *If you need a refill on your cardiac medications before your next  appointment, please call your pharmacy*   Lab Work: Lipids, ALT, Bmet in 7month prior to next appointment If you have labs (blood work) drawn today and your tests are completely normal, you will receive your results only by: [place](if you have MyChart) OR A paper copy in the mail If you have any lab test that is abnormal or we need to change your treatment, we will call you to review the  results.  Follow-Up: [facility] and your [facility] needs are our priority.  As part of our continuing mission to provide you with exceptional heart care, we have created designated Provider Care Teams.  These Care Teams include your primary Cardiologist (physician) and Advanced Practice Providers (APPs -  Physician Assistants and Nurse Practitioners) who all work together to provide you with the care you need, when you need it.  We recommend signing up for the patient portal called "MyChart".  Sign up information is provided on this After Visit Summary.  MyChart is used to connect with patients for Virtual Visits (Telemedicine).  Patients are able to view lab/test results, encounter notes, upcoming appointments, etc.  Non-urgent messages can be sent to your provider as well.   To learn more about what you can do with MyChart, go to [email or web address]    Your next appointment:   6 month(s)  The format for your next appointment:   In Person  Provider:   PMertie Moores MD {  Other: Refer to [place] PMertie Moores MD  05/25/2022 5:54 PM    [place]

## 2022-05-25 ENCOUNTER — Encounter: Payer: Self-pay | Admitting: Cardiovascular Disease

## 2022-05-25 ENCOUNTER — Ambulatory Visit (INDEPENDENT_AMBULATORY_CARE_PROVIDER_SITE_OTHER): Payer: BC Managed Care – PPO | Admitting: Cardiovascular Disease

## 2022-05-25 VITALS — BP 124/80 | HR 84 | Ht 69.0 in | Wt 170.8 lb

## 2022-05-25 DIAGNOSIS — R002 Palpitations: Secondary | ICD-10-CM

## 2022-05-25 DIAGNOSIS — E782 Mixed hyperlipidemia: Secondary | ICD-10-CM | POA: Diagnosis not present

## 2022-05-25 NOTE — Patient Instructions (Addendum)
Medication Instructions:  Your physician recommends that you continue on your current medications as directed. Please refer to the Current Medication list given to you today.  *If you need a refill on your cardiac medications before your next appointment, please call your pharmacy*   Lab Work: Lipids, ALT, Bmet in 32months prior to next appointment If you have labs (blood work) drawn today and your tests are completely normal, you will receive your results only by: MyChart Message (if you have MyChart) OR A paper copy in the mail If you have any lab test that is abnormal or we need to change your treatment, we will call you to review the results.  Follow-Up: At [facility], you and your health needs are our priority.  As part of our continuing mission to provide you with exceptional heart care, we have created designated Provider Care Teams.  These Care Teams include your primary Cardiologist (physician) and Advanced Practice Providers (APPs -  Physician Assistants and Nurse Practitioners) who all work together to provide you with the care you need, when you need it.  We recommend signing up for the patient portal called "MyChart".  Sign up information is provided on this After Visit Summary.  MyChart is used to connect with patients for Virtual Visits (Telemedicine).  Patients are able to view lab/test results, encounter notes, upcoming appointments, etc.  Non-urgent messages can be sent to your provider as well.   To learn more about what you can do with MyChart, go to [email or web address].    Your next appointment:   6 month(s)  The format for your next appointment:   In Person  Provider:   Kristeen Miss, MD {  Other: Refer to [facility]

## 2022-05-27 DIAGNOSIS — E785 Hyperlipidemia, unspecified: Secondary | ICD-10-CM | POA: Diagnosis not present

## 2022-05-27 DIAGNOSIS — E1169 Type 2 diabetes mellitus with other specified complication: Secondary | ICD-10-CM | POA: Diagnosis not present

## 2022-05-27 DIAGNOSIS — I10 Essential (primary) hypertension: Secondary | ICD-10-CM | POA: Diagnosis not present

## 2022-06-09 DIAGNOSIS — E785 Hyperlipidemia, unspecified: Secondary | ICD-10-CM | POA: Diagnosis not present

## 2022-06-09 DIAGNOSIS — Z1211 Encounter for screening for malignant neoplasm of colon: Secondary | ICD-10-CM | POA: Diagnosis not present

## 2022-06-09 DIAGNOSIS — I10 Essential (primary) hypertension: Secondary | ICD-10-CM | POA: Diagnosis not present

## 2022-06-09 DIAGNOSIS — K219 Gastro-esophageal reflux disease without esophagitis: Secondary | ICD-10-CM | POA: Diagnosis not present

## 2022-06-16 ENCOUNTER — Ambulatory Visit: Payer: BC Managed Care – PPO

## 2022-06-22 DIAGNOSIS — E559 Vitamin D deficiency, unspecified: Secondary | ICD-10-CM | POA: Diagnosis not present

## 2022-06-22 DIAGNOSIS — E1169 Type 2 diabetes mellitus with other specified complication: Secondary | ICD-10-CM | POA: Diagnosis not present

## 2022-06-22 DIAGNOSIS — E785 Hyperlipidemia, unspecified: Secondary | ICD-10-CM | POA: Diagnosis not present

## 2022-06-22 DIAGNOSIS — I10 Essential (primary) hypertension: Secondary | ICD-10-CM | POA: Diagnosis not present

## 2022-07-24 DIAGNOSIS — I10 Essential (primary) hypertension: Secondary | ICD-10-CM | POA: Diagnosis not present

## 2022-07-24 DIAGNOSIS — K219 Gastro-esophageal reflux disease without esophagitis: Secondary | ICD-10-CM | POA: Diagnosis not present

## 2022-07-24 DIAGNOSIS — E119 Type 2 diabetes mellitus without complications: Secondary | ICD-10-CM | POA: Diagnosis not present

## 2022-07-24 DIAGNOSIS — E785 Hyperlipidemia, unspecified: Secondary | ICD-10-CM | POA: Diagnosis not present

## 2022-07-29 DIAGNOSIS — Z1211 Encounter for screening for malignant neoplasm of colon: Secondary | ICD-10-CM | POA: Diagnosis not present

## 2022-08-06 ENCOUNTER — Other Ambulatory Visit: Payer: Self-pay | Admitting: Cardiovascular Disease

## 2022-08-07 ENCOUNTER — Other Ambulatory Visit: Payer: Self-pay

## 2022-08-07 MED ORDER — ROSUVASTATIN CALCIUM 20 MG PO TABS: 20.0000 mg | ORAL_TABLET | Freq: Every day | ORAL | 2 refills | Status: DC

## 2022-08-27 DIAGNOSIS — E1169 Type 2 diabetes mellitus with other specified complication: Secondary | ICD-10-CM | POA: Diagnosis not present

## 2022-09-28 DIAGNOSIS — Z0000 Encounter for general adult medical examination without abnormal findings: Secondary | ICD-10-CM | POA: Diagnosis not present

## 2022-09-28 DIAGNOSIS — Z23 Encounter for immunization: Secondary | ICD-10-CM | POA: Diagnosis not present

## 2022-09-28 DIAGNOSIS — E785 Hyperlipidemia, unspecified: Secondary | ICD-10-CM | POA: Diagnosis not present

## 2022-09-28 DIAGNOSIS — E1169 Type 2 diabetes mellitus with other specified complication: Secondary | ICD-10-CM | POA: Diagnosis not present

## 2022-09-28 DIAGNOSIS — I10 Essential (primary) hypertension: Secondary | ICD-10-CM | POA: Diagnosis not present

## 2022-09-28 DIAGNOSIS — L719 Rosacea, unspecified: Secondary | ICD-10-CM | POA: Diagnosis not present

## 2022-09-29 ENCOUNTER — Other Ambulatory Visit: Payer: Self-pay | Admitting: Family Medicine

## 2022-09-29 DIAGNOSIS — E2839 Other primary ovarian failure: Secondary | ICD-10-CM

## 2022-10-01 ENCOUNTER — Other Ambulatory Visit: Payer: Self-pay | Admitting: Family Medicine

## 2022-10-01 DIAGNOSIS — Z1231 Encounter for screening mammogram for malignant neoplasm of breast: Secondary | ICD-10-CM

## 2022-11-25 ENCOUNTER — Ambulatory Visit
Admission: RE | Admit: 2022-11-25 | Discharge: 2022-11-25 | Disposition: A | Discharge status: Home / Self Care | Payer: BC Managed Care – PPO | Source: Ambulatory Visit | Attending: Family Medicine | Admitting: Family Medicine

## 2022-11-25 DIAGNOSIS — Z1231 Encounter for screening mammogram for malignant neoplasm of breast: Secondary | ICD-10-CM | POA: Diagnosis not present

## 2023-01-08 IMAGING — MG MM DIGITAL SCREENING BILAT W/ TOMO AND CAD
8 series · 8 of 24 positions shown · non-contrast
Comparison: Previous exam(s).

CLINICAL DATA: Screening.

EXAM:
DIGITAL SCREENING BILATERAL MAMMOGRAM WITH TOMOSYNTHESIS AND CAD
TECHNIQUE: Bilateral screening digital craniocaudal and mediolateral oblique
mammograms were obtained. Bilateral screening digital breast
tomosynthesis was performed. The images were evaluated with
computer-aided detection.

[R MLO synth-2D]
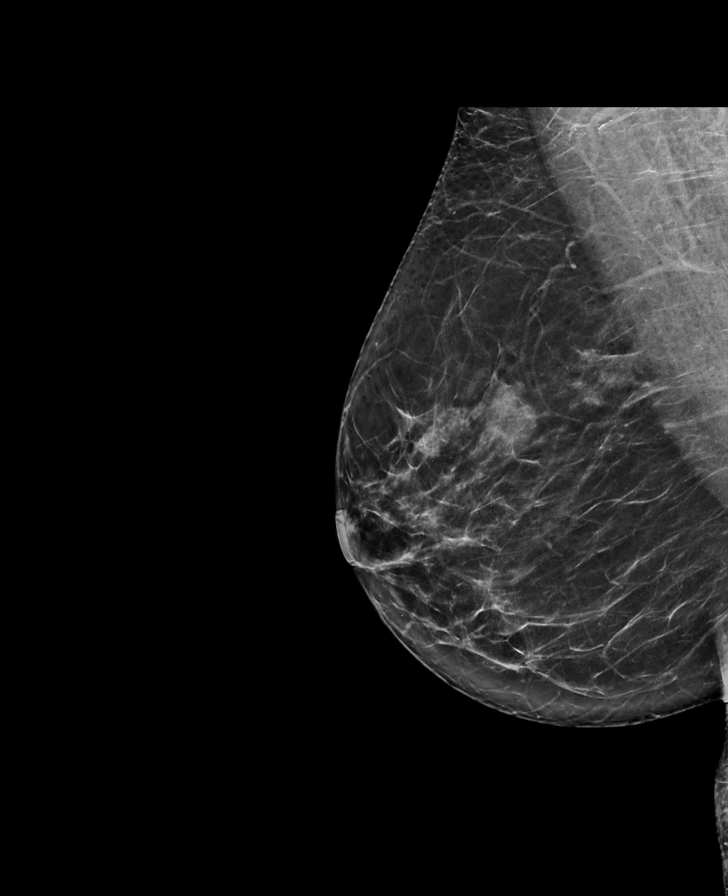

[L MLO synth-2D]
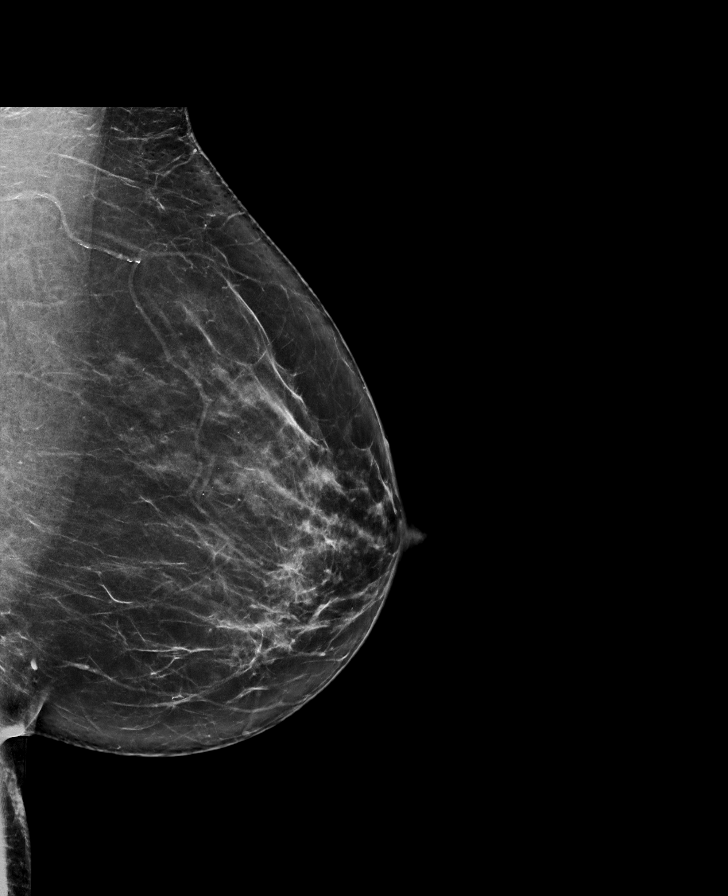

[L CC synth-2D]
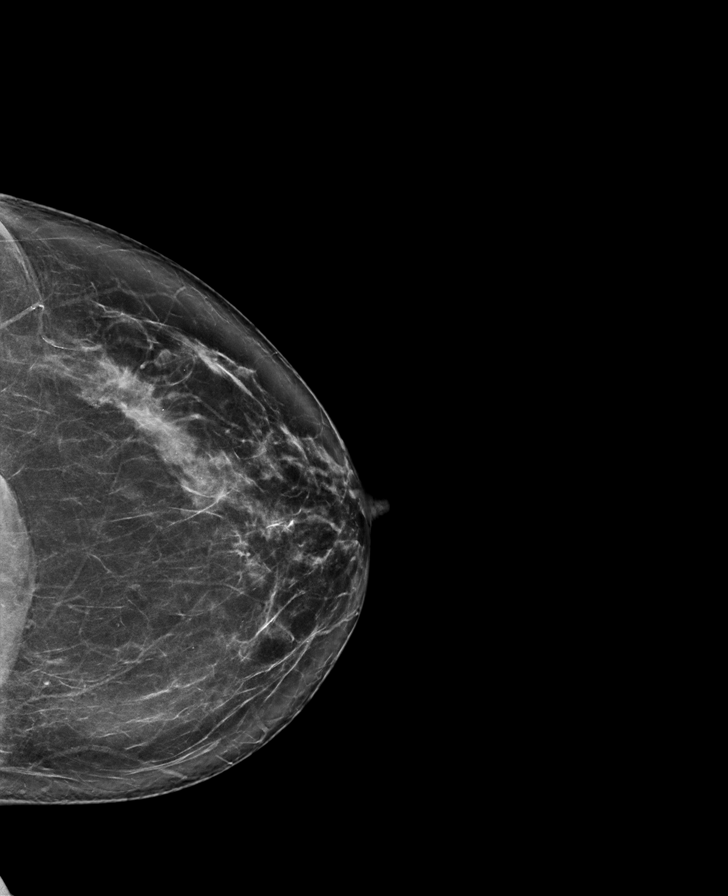

[R CC synth-2D]
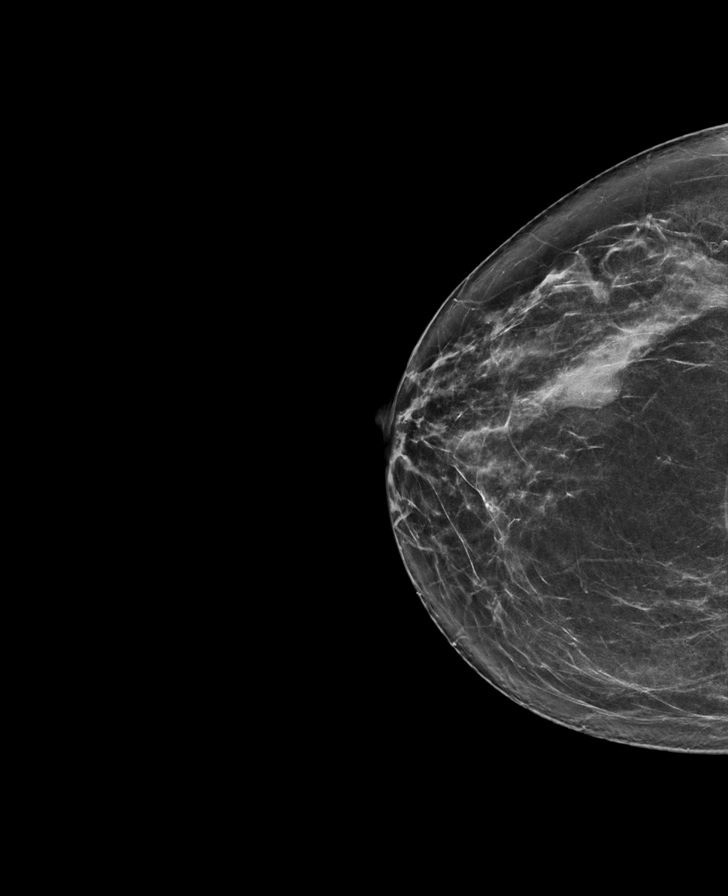

[R CC tomo · tomo slice 39/76.0]
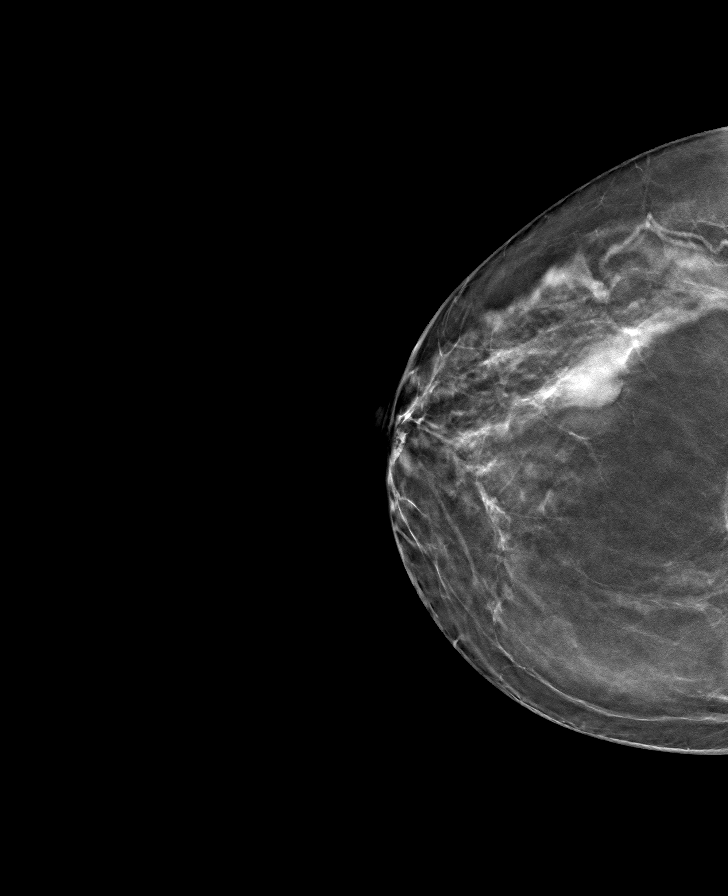

[L CC tomo · tomo slice 41/80.0]
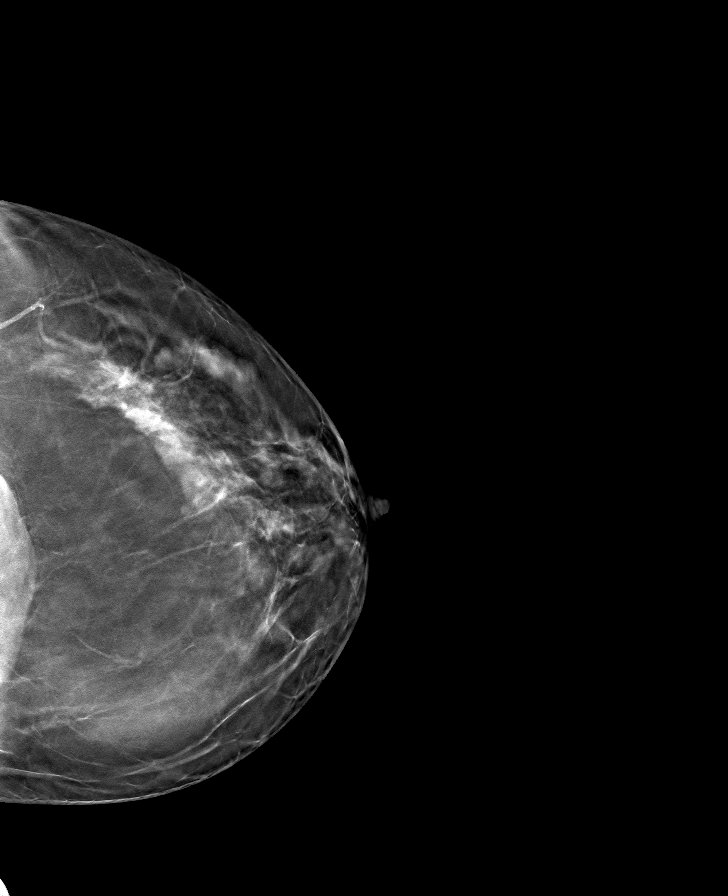

[R MLO tomo · tomo slice 41/81.0]
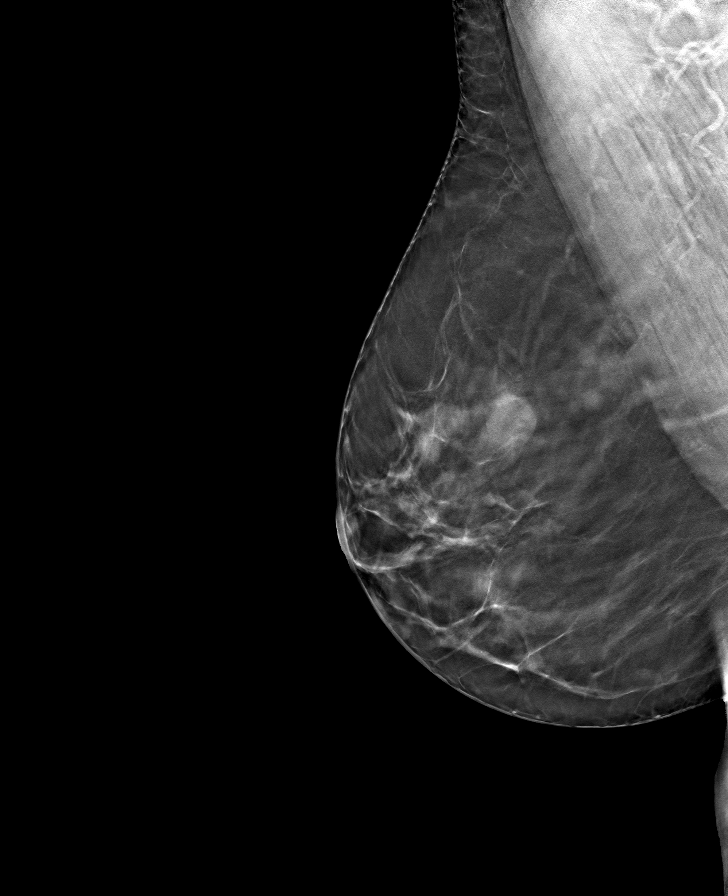

[L MLO tomo · tomo slice 40/79.0]
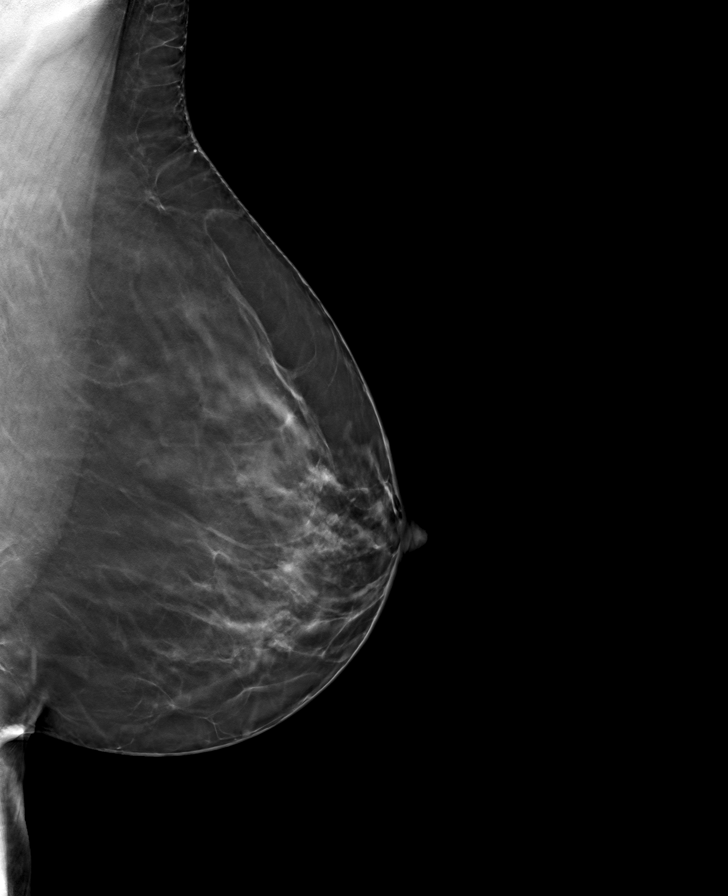

[8 of 24 positions shown; findings below may reference images not displayed]

ACR Breast Density Category c: The breast tissue is heterogeneously
dense, which may obscure small masses.
FINDINGS: In the left breast, calcifications warrant further evaluation. In
the right breast, no findings suspicious for malignancy.
IMPRESSION: Further evaluation is suggested for calcifications in the left
breast.

RECOMMENDATION:
Diagnostic mammogram of the left breast. (Code:AS-B-FFA)

The patient will be contacted regarding the findings, and additional
imaging will be scheduled.

BI-RADS CATEGORY  0: Incomplete. Need additional imaging evaluation
and/or prior mammograms for comparison.

## 2023-01-21 DIAGNOSIS — I10 Essential (primary) hypertension: Secondary | ICD-10-CM | POA: Diagnosis not present

## 2023-01-21 DIAGNOSIS — E785 Hyperlipidemia, unspecified: Secondary | ICD-10-CM | POA: Diagnosis not present

## 2023-01-21 DIAGNOSIS — E1169 Type 2 diabetes mellitus with other specified complication: Secondary | ICD-10-CM | POA: Diagnosis not present

## 2023-01-21 DIAGNOSIS — E559 Vitamin D deficiency, unspecified: Secondary | ICD-10-CM | POA: Diagnosis not present

## 2023-01-31 ENCOUNTER — Encounter: Payer: Self-pay | Admitting: Cardiovascular Disease

## 2023-01-31 NOTE — Progress Notes (Signed)
Cardiology Office Note:    Date:  02/01/2023   ID:  Mary Porter, DOB September 10, 1961, MRN [id number]  PCP:  Harlan Stains, MD  Cardiologist:  Jaki Hammerschmidt  Electrophysiologist:  None   Referring MD: Harlan Stains, MD   Chief Complaint  Patient presents with   Hyperlipidemia     02/15/20    Mary Porter is a 62 y.o. female with a hx of hyperlipidemia.  She recently has developed some palpitations with exercise.  We are asked to see her today by Dr. Dema Severin for further evaluation of these palpitations.  Labs from Dr. Dema Severin were reviewed.  She had a NMR lipid panel performed this week.  Her LDL particle number is 1470 .  The total cholesterol is 210.  The LDL 130.  Her HDL is 70.  Triglyceride levels 49. Vitamin D level is 23.3.  Friend of Bea Laura.  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   Has noticed that her heart is beating hard after several fast sets  Swam [place] several years ago.    Has had episodes of tachycardia while standing in the kitchen .   Resolves with deep breaths .  Has noticed some tingling in her legs after swimming hard.  She has noticed some shortness of breath compared to how she felt last year.   Has been working out with a Physiological scientist .   gets winded when doing suicides with the trainer .     April 23, 2020: Mary Porter is seen today for follow-up visit.  She has a history of hyperlipidemia and has had some palpitations and shortness of breath with exercise.  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  Echocardiogram from February 29, 2020 reveals normal left ventricular systolic function.  She has grade 1 diastolic dysfunction.  She has no so that is why we ordered a valvular abnormalities.  Coronary calcium score of 178. This was 95th percentile for age and sex matched control. Calcium noted in the LAD and RCA.  Given her relatively high coronary calcium score we changed her atorvastatin to rosuvastatin.  We have scheduled  her for an exercise Myoview study. We discussed the fact that endurance athletes tend to have higher coronary calcium scores.  She is able to exercise at a very high level without any angina.  She does get short of breath if she does multiple sets of sprints but she thinks that this is probably fairly normal for someone her age.  Sept, 23, 2021;  Mary Porter is seen toda for follow  Up of her hyperlipidemia, elevated coronary calcium score. She is a long Wellsite geologist. Stress myoview showed excellent exercise capacity with no ischemia ,  Walked for 13 minutes.   Troubled by having to wear a mask during the treadmill.  Her LDL remains mildl elevated  .  103,  She never picked up her zetia.  Lipids look the same as her previous levels  Her goal is <70  Dec. 13, 2021: Mary Porter is seen for follow up of her HLD and elevated coronary calcium score GXT was negative for ischemia Still swimming  LFTs are higher in the rosuvastatin  20 mg daily , Also now on zetia 10 mg a day  She drinks 1-2 glasses of wine at night .  No myalgias.  Aug. 3, 2022: Mary Porter is seen today for follow up of her HLD and coronary artery callcifications Father had cabg in his 37s  Has occasional palpitations  Last only for a few seconds. Typically does a valsalva with resolution  Does not occur with exercise  Has been trying to drink while swimming   May 19, 2022 Appt cancelled  May 23, 2022 Mary Porter is seen for follow up of her HLD and coronary artery calcifications  Coronary calcium score of 178. This was 95th percentile for age and sex matched control. Calcium noted in the LAD and RCA. Occasional palpitations  Wt is 174 lbs   Has been cutting back on her wine and caffeine. We discussed her weight   Labs Glucose was 164.  No family hx of DM   February 01, 2023 Mary Porter is seen for follow up of her HLD, CAC Coronary calcium score of 178. This was 95th percentile for age and sex matched control.  Calcium noted in the LAD and RCA. She is seen as a work in visit  Has had some bilateral arm pain   Labs from [place] from January 21, 2023 reveals Total cholesterol is 178 HDL is 85 LDL is 79 Triglyceride level 73  On Zetia 10 mg a day  Rosuvastatin 20   Has some DOE with fast interval sprints while swimming but no chest pain        Past Medical History:  Diagnosis Date   Acne rosacea    BRCA1 negative 03/18/2009   [place] of Breast Cancer - mother, MGM, MA, MGA X 2, Mcousin   Cystocele    Exercise-induced asthma    Family history of BRCA1 gene positive    Family history of breast cancer    Family history of breast cancer in female    Family history of ovarian cancer    History of 2019 novel coronavirus disease (COVID-19)    History of COVID-19    Hypercholesterolemia    Hyperlipidemia    Hypertension    Kidney stone 20's   Nephrolithiasis    Osteopenia    Palpitations    Prediabetes    Rectocele    Torn meniscus    right knee   Vitamin D deficiency     Past Surgical History:  Procedure Laterality Date   COLONOSCOPY     INTRAUTERINE DEVICE (IUD) INSERTION  01/31/2018   Mirena    VAGINAL DELIVERY     x3    Current Medications: Current Meds  Medication Sig   Cholecalciferol (VITAMIN D3) 50 MCG (2000 UT) capsule Take 2,000 Units by mouth daily.   ezetimibe (ZETIA) 10 MG tablet TAKE 1 TABLET(10 MG) BY MOUTH DAILY   rosuvastatin (CRESTOR) 20 MG tablet Take 1 tablet (20 mg total) by mouth daily.   valsartan (DIOVAN) 160 MG tablet Take 160 mg by mouth daily.     Allergies:   Patient has no known allergies.   Social History   Socioeconomic History   Marital status: Married    Spouse name: Not on file   Number of children: Not on file   Years of education: Not on file   Highest education level: Not on file  Occupational History   Not on file  Tobacco Use   Smoking status: Never   Smokeless tobacco: Never  Vaping Use   Vaping Use: Never used   Substance and Sexual Activity   Alcohol use: Yes    Alcohol/week: 5.0 standard drinks of alcohol    Types: 5 Glasses of wine per week   Drug use: No   Sexual activity: Yes    Birth control/protection: Surgical,  I.U.D.    Comment: vasectomy  Other Topics Concern   Not on file  Social History Narrative   Not on file   Social Determinants of Health   Financial Resource Strain: Not on file  Food Insecurity: Not on file  Transportation Needs: Not on file  Physical Activity: Not on file  Stress: Not on file  Social Connections: Not on file     Family History: The patient's family history includes BRCA 1/2 in her brother, cousin, maternal aunt, maternal aunt, maternal aunt, maternal grandfather, mother, sister, and sister; Breast cancer in her maternal aunt, maternal aunt, and sister; Breast cancer (age of onset: 2) in her cousin; Breast cancer (age of onset: 50) in her maternal aunt; Breast cancer (age of onset: 67) in her mother; Cancer - Colon in her maternal grandfather; Heart disease in her father and paternal grandfather; Hypertension in her father; Ovarian cancer (age of onset: 20) in her cousin.  Father had cornonary stents  ROS:   Please see the history of present illness.     All other systems reviewed and are negative.  EKGs/Labs/Other Studies Reviewed:    The following studies were reviewed today:   EKG:        Recent Labs: No results found for requested labs within last 365 days.  Recent Lipid Panel    Component Value Date/Time   CHOL 159 10/11/2020 0731   TRIG 68 10/11/2020 0731   HDL 80 10/11/2020 0731   CHOLHDL 2.0 10/11/2020 0731   LDLCALC 66 10/11/2020 0731    Physical Exam:     Physical Exam: Blood pressure 126/80, pulse 74, height 5\' 9"  (1.753 m), weight 159 lb 3.2 oz (72.2 kg), last menstrual period 01/31/2018, SpO2 97 %.      GEN:  Well nourished, well developed in no acute distress HEENT: Normal NECK: No JVD; No carotid  bruits LYMPHATICS: No lymphadenopathy CARDIAC: RRR , no murmurs, rubs, gallops RESPIRATORY:  Clear to auscultation without rales, wheezing or rhonchi  ABDOMEN: Soft, non-tender, non-distended MUSCULOSKELETAL:  No edema; No deformity  SKIN: Warm and dry NEUROLOGIC:  Alert and oriented x 3      ASSESSMENT:    1. Mixed hyperlipidemia   2. Coronary artery calcification       PLAN:      1.  Coronary artery calcifications:     She had a coronary calcium score Coronary calcium score of 178. This was 95th percentile for age and sex matched control. Calcium noted in the LAD and RCA  LDL is 79 I would like for her LDL to be 50-70 Will refer her to the lipid clinic to consider PCSK9 inhibitor   She had some unusual arm pain but I am not sure that this was angina.  It was not related to exercise.  Of asked her to let me know if she develops any episodes of chest discomfort or arm discomfort associated with her swimming.    2.  Hyperlipidemia:    LDL is 79 I would like for her LDL to be 50-70 Will refer her to the lipid clinic to consider PCSK9 inhibitor        3.   Elevated glucose:        Medication Adjustments/Labs and Tests Ordered: Current medicines are reviewed at length with the patient today.  Concerns regarding medicines are outlined above.  Orders Placed This Encounter  Procedures   AMB Referral to [facility] Pharm-D    No orders of the defined types  were placed in this encounter.    Patient Instructions  Medication Instructions:  Your physician recommends that you continue on your current medications as directed. Please refer to the Current Medication list given to you today.  *If you need a refill on your cardiac medications before your next appointment, please call your pharmacy*   Lab Work: NONE If you have labs (blood work) drawn today and your tests are completely normal, you will receive your results only by: [place] (if you have  MyChart) OR A paper copy in the mail If you have any lab test that is abnormal or we need to change your treatment, we will call you to review the results.   Testing/Procedures: Ambulatory referral to lipid clinic (PCSK9i)   Follow-Up: At [facility], you and your health needs are our priority.  As part of our continuing mission to provide you with exceptional heart care, we have created designated Provider Care Teams.  These Care Teams include your primary Cardiologist (physician) and Advanced Practice Providers (APPs -  Physician Assistants and Nurse Practitioners) who all work together to provide you with the care you need, when you need it.  We recommend signing up for the patient portal called "MyChart".  Sign up information is provided on this After Visit Summary.  MyChart is used to connect with patients for Virtual Visits (Telemedicine).  Patients are able to view lab/test results, encounter notes, upcoming appointments, etc.  Non-urgent messages can be sent to your provider as well.   To learn more about what you can do with MyChart, go to [email or web address].    Your next appointment:   6 month(s)  Provider:   Mertie Moores, MD        Signed, Mertie Moores, MD  02/01/2023 5:32 PM    [place]

## 2023-02-01 ENCOUNTER — Encounter: Payer: Self-pay | Admitting: Cardiovascular Disease

## 2023-02-01 ENCOUNTER — Ambulatory Visit: Payer: BC Managed Care – PPO | Attending: Cardiovascular Disease | Admitting: Cardiovascular Disease

## 2023-02-01 VITALS — BP 126/80 | HR 74 | Ht 69.0 in | Wt 159.2 lb

## 2023-02-01 DIAGNOSIS — E782 Mixed hyperlipidemia: Secondary | ICD-10-CM

## 2023-02-01 DIAGNOSIS — I2510 Atherosclerotic heart disease of native coronary artery without angina pectoris: Secondary | ICD-10-CM

## 2023-02-01 DIAGNOSIS — I2584 Coronary atherosclerosis due to calcified coronary lesion: Secondary | ICD-10-CM

## 2023-02-01 NOTE — Patient Instructions (Signed)
Medication Instructions:  Your physician recommends that you continue on your current medications as directed. Please refer to the Current Medication list given to you today.  *If you need a refill on your cardiac medications before your next appointment, please call your pharmacy*   Lab Work: NONE If you have labs (blood work) drawn today and your tests are completely normal, you will receive your results only by: [place] (if you have MyChart) OR A paper copy in the mail If you have any lab test that is abnormal or we need to change your treatment, we will call you to review the results.   Testing/Procedures: Ambulatory referral to lipid clinic (PCSK9i)   Follow-Up: At [facility], you and your health needs are our priority.  As part of our continuing mission to provide you with exceptional heart care, we have created designated Provider Care Teams.  These Care Teams include your primary Cardiologist (physician) and Advanced Practice Providers (APPs -  Physician Assistants and Nurse Practitioners) who all work together to provide you with the care you need, when you need it.  We recommend signing up for the patient portal called "MyChart".  Sign up information is provided on this After Visit Summary.  MyChart is used to connect with patients for Virtual Visits (Telemedicine).  Patients are able to view lab/test results, encounter notes, upcoming appointments, etc.  Non-urgent messages can be sent to your provider as well.   To learn more about what you can do with MyChart, go to [email or web address].    Your next appointment:   6 month(s)  Provider:   Mertie Moores, MD

## 2023-02-02 ENCOUNTER — Ambulatory Visit: Payer: BC Managed Care – PPO | Attending: Cardiovascular Disease | Admitting: Pharmacist

## 2023-02-02 ENCOUNTER — Telehealth: Payer: Self-pay | Admitting: Pharmacist

## 2023-02-02 DIAGNOSIS — E782 Mixed hyperlipidemia: Secondary | ICD-10-CM

## 2023-02-02 NOTE — Telephone Encounter (Signed)
Key: [id number] Repatha PA From questions, can tell PA will be denied. Will need to appeal. I have appealed for this same insurance and same situation before. Ultimately is was denied, but it was forced to an external review due to a split decision.

## 2023-02-02 NOTE — Patient Instructions (Signed)
To get your $5 Repatha copay card; [email or web address] Paying for Repatha (white bar across top).  Scroll down to "options for insurance situations" and click on "I have commercial or private insurance" - scroll down and click on the blue highlighted  "click here" to learn more about the [place] you have a prescription - Click YES then scroll down and mark the box "Repatha Copay Card", then continue to scroll down and enter the personal information. There are two blue boxes with "I agree" next to them. The first is optional if you want to enroll in their patient support program.  This one is voluntary.  The second is their patient authorization, and you must mark this one to continue.   Lastly they ask if they can contact you regarding information about market research about the drug or disease state.  You can mark either box. Click "next" and continue to follow steps to get your copay card  I will submit a prior authorization for Repatha. I will call you once I hear back. Please call me at [phone] with any questions.   Repatha is a cholesterol medication that improved your body's ability to get rid of "bad cholesterol" known as LDL. It can lower your LDL up to 60%! It is an injection that is given under the skin every 2 weeks. The medication often requires a prior authorization from your insurance company. We will take care of submitting all the necessary information to your insurance company to get it approved. The most common side effects of Repatha include runny nose, symptoms of the common cold, rarely flu or flu-like symptoms, back/muscle pain in about 3-4% of the patients, and redness, pain, or bruising at the injection site. Tell your healthcare provider if you have any side effect that bothers you or that does not go away.

## 2023-02-02 NOTE — Progress Notes (Unsigned)
Patient ID: Mary Porter                 DOB: January 13, 1961                    MRN: [id number]      HPI: Mary Porter is a 62 y.o. female patient referred to lipid clinic by Dr. Acie Fredrickson. PMH is significant for hyperlipidemia, elevated CAC score 178 95th percentile (in 2021).  Patient has been on rosuvastatin 20mg  daily and ezetimibe 10mg  daily. LDL-C is 79. Patient was diagnosed with DM recently. This is very upsetting to her as she is very physically active. She has been able to get her A1C down with diet.  She is interested in decreasing her cardiovascular risk. Swims at least 3 times a week, personal trainer with weight training twice a week along with pickleball and walking.  Reviewed PCSK9 inhibitors. Discussed mechanisms of action, dosing, side effects and potential decreases in LDL cholesterol.  Also reviewed cost information and potential options for copay assistance. Patient does have a high deductible plan. Its possible she may run out of money on copay card before deductible is met.     Current Medications: rosuvastatin 20mg  daily, ezetimibe 10mg  daily Intolerances: none Risk Factors: + CAC score, family hx of premature CAD,DM, HTN LDL-C goal: <55 ApoB goal: <70  Diet: does eat out a decent amount- tires to choose healthy option, salad and salmon, burger without bun, eggs a few days a week for breakfast, pizza occasionally, cut back on bread and alcohol  Exercise: swims at least 3 times a week, personal training twice a week, pickleball  Family History: grandfather and father had premature CAD  Social History: no tobacco, + ETOH (has cut back to 3-4 glasses per week)  Labs: Labs from [place] from January 21, 2023 reveals Total cholesterol is 178 HDL is 85 LDL is 79 Triglyceride level 73  On Zetia 10 mg a day  Rosuvastatin 20   Lipid Panel     Component Value Date/Time   CHOL 159 10/11/2020 0731   TRIG 68 10/11/2020 0731   HDL 80 10/11/2020 0731   CHOLHDL 2.0  10/11/2020 0731   LDLCALC 66 10/11/2020 0731   LABVLDL 13 10/11/2020 0731    Past Medical History:  Diagnosis Date   Acne rosacea    BRCA1 negative 03/18/2009   [place] of Breast Cancer - mother, MGM, MA, MGA X 2, Mcousin   Cystocele    Exercise-induced asthma    Family history of BRCA1 gene positive    Family history of breast cancer    Family history of breast cancer in female    Family history of ovarian cancer    History of 2019 novel coronavirus disease (COVID-19)    History of COVID-19    Hypercholesterolemia    Hyperlipidemia    Hypertension    Kidney stone 20's   Nephrolithiasis    Osteopenia    Palpitations    Prediabetes    Rectocele    Torn meniscus    right knee   Vitamin D deficiency     Current Outpatient Medications on File Prior to Visit  Medication Sig Dispense Refill   Cholecalciferol (VITAMIN D3) 50 MCG (2000 UT) capsule Take 2,000 Units by mouth daily.     ezetimibe (ZETIA) 10 MG tablet TAKE 1 TABLET(10 MG) BY MOUTH DAILY 90 tablet 2   rosuvastatin (CRESTOR) 20 MG tablet Take 1 tablet (20 mg total) by mouth  daily. 90 tablet 2   valsartan (DIOVAN) 160 MG tablet Take 160 mg by mouth daily.  5   No current facility-administered medications on file prior to visit.    No Known Allergies  Assessment/Plan:  1. Hyperlipidemia -  Hyperlipidemia Assessment: LDL-C is above goal of <55, Non-HDL-C is above goal of <80 Patient interested in being more aggressive with medication therapy Reviewed PCSK-9 inhibitors. Discussed mechanisms of action, dosing, side effects and potential decreases in LDL cholesterol.  Also reviewed cost information and potential options for copay assistance. Patient does have a high deductible plan. Its possible she may run out of money on copay card before deductible is met.   Reviewed diet- decrease saturated fats, continue to decrease alcohol, focus on increased fiber intake and limit refined carbs  Plan: Will submit PA for  Repatha Patient will download co-pay card Can stop zetia is PCKS9i is started Continue rosuvastatin 20mg  daily    Thank you,  Ramond Dial, Pharm.D, BCPS, CPP [facility] [place] [address], [place], [place] [address]  Phone: [phone]; Fax: [phone]

## 2023-02-02 NOTE — Assessment & Plan Note (Signed)
Assessment: LDL-C is above goal of <55, Non-HDL-C is above goal of <80 Patient interested in being more aggressive with medication therapy Reviewed PCSK-9 inhibitors. Discussed mechanisms of action, dosing, side effects and potential decreases in LDL cholesterol.  Also reviewed cost information and potential options for copay assistance. Patient does have a high deductible plan. Its possible she may run out of money on copay card before deductible is met.   Reviewed diet- decrease saturated fats, continue to decrease alcohol, focus on increased fiber intake and limit refined carbs  Plan: Will submit PA for Repatha Patient will download co-pay card Can stop zetia is PCKS9i is started Continue rosuvastatin 20mg  daily

## 2023-02-04 ENCOUNTER — Encounter: Payer: Self-pay | Admitting: Cardiovascular Disease

## 2023-02-05 NOTE — Telephone Encounter (Signed)
Discussed with patient. Will try to appeal decision.

## 2023-02-08 NOTE — Telephone Encounter (Signed)
Patient will need to fill out appeals representative form. Pt made aware. Will fill out and send back.

## 2023-02-15 DIAGNOSIS — M25562 Pain in left knee: Secondary | ICD-10-CM | POA: Diagnosis not present

## 2023-02-25 NOTE — Telephone Encounter (Signed)
I faxed appeals last week. Will await determination.

## 2023-03-16 ENCOUNTER — Ambulatory Visit
Admission: RE | Admit: 2023-03-16 | Discharge: 2023-03-16 | Disposition: A | Discharge status: Home / Self Care | Payer: BC Managed Care – PPO | Source: Ambulatory Visit | Attending: Family Medicine | Admitting: Family Medicine

## 2023-03-16 DIAGNOSIS — Z13820 Encounter for screening for osteoporosis: Secondary | ICD-10-CM | POA: Diagnosis not present

## 2023-03-16 DIAGNOSIS — M25562 Pain in left knee: Secondary | ICD-10-CM | POA: Diagnosis not present

## 2023-03-16 DIAGNOSIS — E2839 Other primary ovarian failure: Secondary | ICD-10-CM

## 2023-04-20 ENCOUNTER — Other Ambulatory Visit (HOSPITAL_COMMUNITY): Payer: Self-pay

## 2023-04-20 NOTE — Telephone Encounter (Signed)
Per test claim PA still pending

## 2023-04-20 NOTE — Telephone Encounter (Signed)
Not sure patient ever sent in representative from. Called pt and LVM

## 2023-06-18 ENCOUNTER — Other Ambulatory Visit: Payer: Self-pay | Admitting: Cardiovascular Disease

## 2023-07-02 ENCOUNTER — Other Ambulatory Visit: Payer: Self-pay | Admitting: Cardiovascular Disease

## 2023-07-22 DIAGNOSIS — I10 Essential (primary) hypertension: Secondary | ICD-10-CM | POA: Diagnosis not present

## 2023-07-22 DIAGNOSIS — I700 Atherosclerosis of aorta: Secondary | ICD-10-CM | POA: Diagnosis not present

## 2023-07-22 DIAGNOSIS — E119 Type 2 diabetes mellitus without complications: Secondary | ICD-10-CM | POA: Diagnosis not present

## 2023-07-22 DIAGNOSIS — E785 Hyperlipidemia, unspecified: Secondary | ICD-10-CM | POA: Diagnosis not present

## 2024-03-01 ENCOUNTER — Other Ambulatory Visit: Payer: Self-pay | Admitting: Family Medicine

## 2024-03-01 DIAGNOSIS — Z0000 Encounter for general adult medical examination without abnormal findings: Secondary | ICD-10-CM

## 2024-03-02 ENCOUNTER — Ambulatory Visit
Admission: RE | Admit: 2024-03-02 | Discharge: 2024-03-02 | Disposition: A | Discharge status: Home / Self Care | Source: Ambulatory Visit | Attending: Family Medicine | Admitting: Family Medicine

## 2024-03-02 DIAGNOSIS — Z0000 Encounter for general adult medical examination without abnormal findings: Secondary | ICD-10-CM

## 2024-03-02 DIAGNOSIS — Z1231 Encounter for screening mammogram for malignant neoplasm of breast: Secondary | ICD-10-CM | POA: Diagnosis not present

## 2024-03-23 ENCOUNTER — Ambulatory Visit (HOSPITAL_BASED_OUTPATIENT_CLINIC_OR_DEPARTMENT_OTHER): Payer: BC Managed Care – PPO | Admitting: Obstetrics & Gynecology

## 2024-03-23 ENCOUNTER — Encounter (HOSPITAL_BASED_OUTPATIENT_CLINIC_OR_DEPARTMENT_OTHER): Payer: Self-pay | Admitting: Obstetrics & Gynecology

## 2024-03-23 ENCOUNTER — Other Ambulatory Visit (HOSPITAL_COMMUNITY)
Admission: RE | Admit: 2024-03-23 | Discharge: 2024-03-23 | Disposition: A | Discharge status: Home / Self Care | Source: Ambulatory Visit | Attending: Obstetrics & Gynecology | Admitting: Obstetrics & Gynecology

## 2024-03-23 VITALS — BP 136/80 | HR 65 | Ht 69.0 in | Wt 171.2 lb

## 2024-03-23 DIAGNOSIS — Z01419 Encounter for gynecological examination (general) (routine) without abnormal findings: Secondary | ICD-10-CM

## 2024-03-23 DIAGNOSIS — N952 Postmenopausal atrophic vaginitis: Secondary | ICD-10-CM

## 2024-03-23 DIAGNOSIS — Z124 Encounter for screening for malignant neoplasm of cervix: Secondary | ICD-10-CM | POA: Diagnosis not present

## 2024-03-23 DIAGNOSIS — Z8041 Family history of malignant neoplasm of ovary: Secondary | ICD-10-CM

## 2024-03-23 DIAGNOSIS — Z8481 Family history of carrier of genetic disease: Secondary | ICD-10-CM

## 2024-03-23 DIAGNOSIS — Z803 Family history of malignant neoplasm of breast: Secondary | ICD-10-CM

## 2024-03-23 MED ORDER — ESTRADIOL 0.1 MG/GM VA CREA: TOPICAL_CREAM | VAGINAL | 1 refills | Status: AC

## 2024-03-23 NOTE — Progress Notes (Signed)
 ANNUAL EXAM Patient name: Mary Porter MRN [id number]  Date of birth: January 01, 1961 Chief Complaint:   Annual Exam/New patient exam/prior pt in 2012  History of Present Illness:   Mary Porter is a 63 y.o. [phone] Caucasian female being seen today for a routine annual exam.  Doing well.    Increased risk of breast cancer.  She saw Marijo Shove in 2019 and decided not to proceed with expanded genetic testing.  She had negative BRCA 1 testing in 2010.    She is doing 3D MMG.  Not doing breast MRI.    Has decreased libido.  Has some desires to be on HRT.    Patient's last menstrual period was 01/31/2018.  Last pap  09/20/2018. Results were: NILM w/ HRHPV negative. H/O abnormal pap: no Last mammogram: 03/02/2024. Results were: normal. Family h/o breast cancer: yes   Last colonoscopy: 07/29/2022.  Negative.  Follow up 10 years.   DEXA:  t score -1.2     03/23/2024    9:46 AM  Depression screen PHQ 2/9  Decreased Interest 0  Down, Depressed, Hopeless 0  PHQ - 2 Score 0    Review of Systems:   Pertinent items are noted in HPI Denies any urinary and bowel changes Pertinent History Reviewed:  Reviewed past medical,surgical, social and family history.  Reviewed problem list, medications and allergies. Physical Assessment:   Vitals:   03/23/24 0943  BP: 136/80  Pulse: 65  Weight: 171 lb 3.2 oz (77.7 kg)  Height: 5\' 9"  (1.753 m)  Body mass index is 25.28 kg/m.        Physical Examination:   General appearance - well appearing, and in no distress  Mental status - alert, oriented to person, place, and time  Psych:  She has a normal mood and affect  Skin - warm and dry, normal color, no suspicious lesions noted  Chest - effort normal, all lung fields clear to auscultation bilaterally  Heart - normal rate and regular rhythm  Neck:  midline trachea, no thyromegaly or nodules  Breasts - breasts appear normal, no suspicious masses, no skin or nipple changes or  axillary  nodes  Abdomen - soft, nontender, nondistended, no masses or organomegaly  Pelvic - VULVA: normal appearing vulva with no masses, tenderness or lesions   VAGINA: normal appearing vagina with normal color and discharge, no lesions   CERVIX: normal appearing cervix without discharge or lesions, no CMT  Thin prep pap is done with HR HPV cotesting  UTERUS: uterus is felt to be normal size, shape, consistency and nontender   ADNEXA: No adnexal masses or tenderness noted.  Rectal - normal rectal, good sphincter tone, no masses felt.   Extremities:  No swelling or varicosities noted  Chaperone present for exam  Assessment & Plan:  1. Well woman exam with routine gynecological exam (Primary) - Pap smear updated today - Mammogram 03/2024 - Colonoscopy /2023 - Bone mineral density 03/2023 - lab work done with PCP, Dr. Camilo Cella - vaccines reviewed/updated  2. Cervical cancer screening - Cytology - PAP( [place])  3. Family history of BRCA1 gene positive - has declined expanded genetic testing  4. Family history of breast cancer - we will proceed with breast MRIs starting this year  5. Family history of ovarian cancer  6.  Vaginal atrophy - will start vaginal estradiol 1 gram pv twice weekly.  Rx to pharmacy.  Follow-up: Return in about 1 year (around 03/23/2025).  Lillian Rein,  MD 03/23/2024 10:42 AM

## 2024-03-30 LAB — CYTOLOGY - PAP
Comment: NEGATIVE
Diagnosis: NEGATIVE
High risk HPV: NEGATIVE

## 2024-03-31 ENCOUNTER — Ambulatory Visit (HOSPITAL_BASED_OUTPATIENT_CLINIC_OR_DEPARTMENT_OTHER): Payer: Self-pay | Admitting: Obstetrics & Gynecology

## 2024-04-07 DIAGNOSIS — E785 Hyperlipidemia, unspecified: Secondary | ICD-10-CM | POA: Diagnosis not present

## 2024-04-07 DIAGNOSIS — I10 Essential (primary) hypertension: Secondary | ICD-10-CM | POA: Diagnosis not present

## 2024-04-07 DIAGNOSIS — E559 Vitamin D deficiency, unspecified: Secondary | ICD-10-CM | POA: Diagnosis not present

## 2024-04-07 DIAGNOSIS — E119 Type 2 diabetes mellitus without complications: Secondary | ICD-10-CM | POA: Diagnosis not present

## 2024-05-02 DIAGNOSIS — E785 Hyperlipidemia, unspecified: Secondary | ICD-10-CM | POA: Diagnosis not present

## 2024-05-02 DIAGNOSIS — E119 Type 2 diabetes mellitus without complications: Secondary | ICD-10-CM | POA: Diagnosis not present

## 2024-05-02 DIAGNOSIS — Z23 Encounter for immunization: Secondary | ICD-10-CM | POA: Diagnosis not present

## 2024-05-02 DIAGNOSIS — I700 Atherosclerosis of aorta: Secondary | ICD-10-CM | POA: Diagnosis not present

## 2024-05-02 DIAGNOSIS — I10 Essential (primary) hypertension: Secondary | ICD-10-CM | POA: Diagnosis not present

## 2024-05-18 DIAGNOSIS — R195 Other fecal abnormalities: Secondary | ICD-10-CM | POA: Diagnosis not present

## 2024-05-22 DIAGNOSIS — R195 Other fecal abnormalities: Secondary | ICD-10-CM | POA: Diagnosis not present

## 2024-09-01 ENCOUNTER — Encounter (HOSPITAL_BASED_OUTPATIENT_CLINIC_OR_DEPARTMENT_OTHER): Payer: Self-pay | Admitting: Obstetrics & Gynecology

## 2024-09-25 ENCOUNTER — Other Ambulatory Visit: Payer: Self-pay

## 2024-09-26 NOTE — Progress Notes (Deleted)
  Cardiology Office Note   Date:  09/26/2024  ID:  Aritza, Brunet 06/13/1961, MRN [id number] PCP: Teresa Channel, MD  [facility] Providers Cardiologist:  Aleene Passe, MD (Inactive)   History of Present Illness AURELIA GRAS is a 63 y.o. female with a past medical history of hyperlipidemia, elevated calcium  score of 178 (95th percentile in 2021), history of alcohol use roughly 3 to 4 glasses of wine per week, hypertension, and palpitations here for follow-up appointment.  She is a magazine features editor and swam [place] several years ago.  Noticed that her heart was beating fast and hard.  Also experience episodes of tachycardia with standing in her kitchen and noticed her legs were tingling after swimming hard.  Some shortness of breath as well compared to the year prior.  She had been working with systems analyst and was getting winded doing suicides with the trainer.  She was last seen in April 2024 and at that time she had some labs done with [facility] revealing total cholesterol 178, HDL 85, LDL 79, triglycerides 73 and remained on Zetia  10 mg a day and rosuvastatin  20 mg a day.  Was experiencing DOE with fast interval sprinting while swimming but no chest pain.  Today, she***  ROS: Pertinent ROS in HPI  Studies Reviewed     Lexiscan Myoview  05/01/20  Nuclear stress EF: 59%. Blood pressure demonstrated a hypertensive response to exercise. There was no ST segment deviation noted during stress. This is a low risk study. The left ventricular ejection fraction is normal (55-65%).   Excellent exercise capacity. No ischemia or infarction on perfusion images.  Risk Assessment/Calculations {Does this patient have ATRIAL FIBRILLATION?[phone]} No BP recorded.  {Refresh Note OR Click here to enter BP  :1}***       Physical Exam VS:  LMP 01/31/2018        Wt Readings from Last 3 Encounters:  03/23/24 171 lb 3.2 oz (77.7 kg)  02/01/23 159 lb 3.2 oz (72.2 kg)  05/25/22 170  lb 12.8 oz (77.5 kg)    GEN: Well nourished, well developed in no acute distress NECK: No JVD; No carotid bruits CARDIAC: ***RRR, no murmurs, rubs, gallops RESPIRATORY:  Clear to auscultation without rales, wheezing or rhonchi  ABDOMEN: Soft, non-tender, non-distended EXTREMITIES:  No edema; No deformity   ASSESSMENT AND PLAN Hyperlipidemia Coronary artery calcification    {Are you ordering a CV Procedure (e.g. stress test, cath, DCCV, TEE, etc)?   Press F2        :[id number]}  Dispo: ***  Signed, Orren LOISE Fabry, PA-C

## 2024-09-27 ENCOUNTER — Ambulatory Visit: Attending: Physician Assistant | Admitting: Physician Assistant

## 2024-09-27 DIAGNOSIS — I2510 Atherosclerotic heart disease of native coronary artery without angina pectoris: Secondary | ICD-10-CM

## 2024-09-27 DIAGNOSIS — E782 Mixed hyperlipidemia: Secondary | ICD-10-CM

## 2024-10-04 ENCOUNTER — Other Ambulatory Visit: Payer: Self-pay

## 2024-10-06 ENCOUNTER — Other Ambulatory Visit: Payer: Self-pay | Admitting: Physician Assistant

## 2024-10-10 ENCOUNTER — Other Ambulatory Visit: Payer: Self-pay | Admitting: Physician Assistant

## 2024-10-10 MED ORDER — EZETIMIBE 10 MG PO TABS: 10.0000 mg | ORAL_TABLET | Freq: Every day | ORAL | 0 refills | Status: AC

## 2024-10-10 MED ORDER — ROSUVASTATIN CALCIUM 20 MG PO TABS: 20.0000 mg | ORAL_TABLET | Freq: Every day | ORAL | 0 refills | Status: AC

## 2024-10-10 MED ORDER — ROSUVASTATIN CALCIUM 20 MG PO TABS: 20.0000 mg | ORAL_TABLET | Freq: Every day | ORAL | 3 refills | Status: DC

## 2024-10-10 MED ORDER — EZETIMIBE 10 MG PO TABS: 10.0000 mg | ORAL_TABLET | Freq: Every day | ORAL | 0 refills | Status: DC

## 2024-10-10 NOTE — Addendum Note (Signed)
 Addended by: BLUFORD, Leilani Cespedes L on: 10/10/2024 12:14 PM   Modules accepted: Orders

## 2025-04-02 ENCOUNTER — Ambulatory Visit (HOSPITAL_BASED_OUTPATIENT_CLINIC_OR_DEPARTMENT_OTHER): Admitting: Obstetrics & Gynecology
